# Patient Record
Sex: Female | Born: 1960
Health system: Southern US, Community
[De-identification: ages and names within clinical notes are randomized; demographics above are authoritative.]

## PROBLEM LIST (undated history)

## (undated) DIAGNOSIS — J40 Bronchitis, not specified as acute or chronic: Secondary | ICD-10-CM

## (undated) DIAGNOSIS — T8859XA Other complications of anesthesia, initial encounter: Secondary | ICD-10-CM

## (undated) DIAGNOSIS — N920 Excessive and frequent menstruation with regular cycle: Secondary | ICD-10-CM

## (undated) DIAGNOSIS — J45909 Unspecified asthma, uncomplicated: Secondary | ICD-10-CM

## (undated) DIAGNOSIS — I341 Nonrheumatic mitral (valve) prolapse: Secondary | ICD-10-CM

## (undated) DIAGNOSIS — R011 Cardiac murmur, unspecified: Secondary | ICD-10-CM

## (undated) DIAGNOSIS — H269 Unspecified cataract: Secondary | ICD-10-CM

## (undated) DIAGNOSIS — R519 Headache, unspecified: Secondary | ICD-10-CM

## (undated) DIAGNOSIS — R7303 Prediabetes: Secondary | ICD-10-CM

## (undated) DIAGNOSIS — I10 Essential (primary) hypertension: Secondary | ICD-10-CM

## (undated) DIAGNOSIS — T4145XA Adverse effect of unspecified anesthetic, initial encounter: Secondary | ICD-10-CM

## (undated) DIAGNOSIS — M1990 Unspecified osteoarthritis, unspecified site: Secondary | ICD-10-CM

## (undated) DIAGNOSIS — E7800 Pure hypercholesterolemia, unspecified: Secondary | ICD-10-CM

## (undated) HISTORY — PX: CARPAL TUNNEL RELEASE: SHX101

## (undated) HISTORY — DX: Excessive and frequent menstruation with regular cycle: N92.0

## (undated) HISTORY — DX: Unspecified asthma, uncomplicated: J45.909

## (undated) HISTORY — PX: FOOT SURGERY: SHX648

## (undated) HISTORY — DX: Unspecified cataract: H26.9

## (undated) HISTORY — PX: TUBAL LIGATION: SHX77

---
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
## 2000-06-17 HISTORY — PX: KNEE ARTHROSCOPY: SHX127

## 2003-05-03 ENCOUNTER — Ambulatory Visit (HOSPITAL_BASED_OUTPATIENT_CLINIC_OR_DEPARTMENT_OTHER): Admission: RE | Admit: 2003-05-03 | Discharge: 2003-05-03 | Payer: Self-pay | Admitting: Orthopaedic Surgery

## 2003-05-03 ENCOUNTER — Encounter (INDEPENDENT_AMBULATORY_CARE_PROVIDER_SITE_OTHER): Payer: Self-pay | Admitting: *Deleted

## 2003-05-03 ENCOUNTER — Ambulatory Visit (HOSPITAL_COMMUNITY): Admission: RE | Admit: 2003-05-03 | Discharge: 2003-05-03 | Payer: Self-pay | Admitting: Orthopaedic Surgery

## 2007-09-27 ENCOUNTER — Emergency Department (HOSPITAL_COMMUNITY): Admission: EM | Admit: 2007-09-27 | Discharge: 2007-09-27 | Payer: Self-pay | Admitting: Emergency Medicine

## 2009-02-08 ENCOUNTER — Ambulatory Visit: Payer: Self-pay | Admitting: Women's Health

## 2009-02-08 ENCOUNTER — Other Ambulatory Visit: Admission: RE | Admit: 2009-02-08 | Discharge: 2009-02-08 | Payer: Self-pay | Admitting: Obstetrics and Gynecology

## 2009-02-08 ENCOUNTER — Encounter: Payer: Self-pay | Admitting: Women's Health

## 2009-08-24 ENCOUNTER — Emergency Department (HOSPITAL_COMMUNITY): Admission: EM | Admit: 2009-08-24 | Discharge: 2009-08-25 | Payer: Self-pay | Admitting: Emergency Medicine

## 2010-01-01 ENCOUNTER — Ambulatory Visit: Payer: Self-pay | Admitting: Women's Health

## 2010-01-23 ENCOUNTER — Ambulatory Visit: Payer: Self-pay | Admitting: Obstetrics and Gynecology

## 2010-02-12 ENCOUNTER — Ambulatory Visit: Payer: Self-pay | Admitting: Women's Health

## 2010-02-12 ENCOUNTER — Other Ambulatory Visit: Admission: RE | Admit: 2010-02-12 | Discharge: 2010-02-12 | Payer: Self-pay | Admitting: Obstetrics and Gynecology

## 2010-03-13 ENCOUNTER — Ambulatory Visit: Payer: Self-pay | Admitting: Obstetrics and Gynecology

## 2010-03-23 ENCOUNTER — Ambulatory Visit: Payer: Self-pay | Admitting: Obstetrics and Gynecology

## 2010-03-23 HISTORY — PX: ENDOMETRIAL ABLATION: SHX621

## 2010-04-10 ENCOUNTER — Ambulatory Visit: Payer: Self-pay | Admitting: Obstetrics and Gynecology

## 2010-11-02 NOTE — Op Note (Signed)
[name], Kristina Washington                        ACCOUNT NO.:  [id number]   MEDICAL RECORD NO.:  [id number]                   PATIENT TYPE:  AMB   LOCATION:  DSC                                  FACILITY:  [facility]   PHYSICIAN:  Lubertha Basque. Jerl Santos, M.D.             DATE OF BIRTH:  May 15, 1961   DATE OF PROCEDURE:  05/03/2003  DATE OF DISCHARGE:                                 OPERATIVE REPORT   PREOPERATIVE DIAGNOSES:  Right foot mass.   POSTOPERATIVE DIAGNOSES:  Right foot mass.   OPERATION PERFORMED:  Excision of right foot mass.   SURGEON:  Lubertha Basque. Jerl Santos, M.D.   ASSISTANT:  Prince Rome, P.A.   ANESTHESIA:  Ankle block followed by general.   INDICATIONS FOR PROCEDURE:  The patient is a 50 year old woman with a long  history of a painful mass on the plantar aspect of the right foot.  This was  consistent with a plantar fibroma or Dupuytren's disease.  We had fabricated  an orthotic which did not seem to help significantly and with continued  difficulty on ambulation, she wished to have this mass excised.  She  underwent a similar procedure on the opposite foot many years ago.  Informed  operative consent was obtained after discussion of possible complications of  reaction to anesthesia, infection, neurovascular injury, flattening of arch,  and the probability of recurrence.   DESCRIPTION OF PROCEDURE:  The patient was taken to the operating suite  where ankle block was attempted.  This did not seem to work and was  converted to a general anesthetic by LMA.  The patient was positioned supine  and prepped and draped in the normal sterile fashion.  After administration  of preoperative antibiotics, the right foot was elevated, exsanguinated, and  a tourniquet inflated about the calf.  A lazy-S incision was made with  dissection down to the plantar fascia.  She had a very large nodule which  measured probably 4 cm by 2 cm and was about 2 cm thick.  This was fully  exposed  with care taken to retract the medial plantar nerve out of harm's  way.  The flexor hallucis longus tendon was also protected.  This large  fibrotic mass was removed along with contiguous portions of the plantar  fascia.  A wide excision was performed in hopes of minimizing chance of  recurrence.  The wound was irrigated followed by release of the tourniquet.  A small amount of bleeding was easily controlled with Bovie cautery.  The  skin edges all bled well and showed good refill distally.  A Penrose drain  was placed in the void followed by closure of subcutaneous tissues with 2-0  undyed Vicryl and skin with nylon.  Adaptic was applied to the wound  followed by a compressive dressing with an Ace wrap.  Estimated blood loss  and intraoperative fluids as well as accurate tourniquet time can be  obtained from anesthesia records.  The specimen was sent to pathology.   DISPOSITION:  The patient was extubated in the operating room and taken to  the recovery room in stable condition.  Plans were for the patient to follow  up in a couple of days for dressing change and drain removal.  She will be  discharged home from here today.  I will contact her by phone tonight.                                              Lubertha Basque Jerl Santos, M.D.   PGD/MEDQ  D:  05/03/2003  T:  05/03/2003  Job:  [id number]

## 2011-02-12 DIAGNOSIS — N920 Excessive and frequent menstruation with regular cycle: Secondary | ICD-10-CM | POA: Insufficient documentation

## 2011-02-20 ENCOUNTER — Other Ambulatory Visit (HOSPITAL_COMMUNITY)
Admission: RE | Admit: 2011-02-20 | Discharge: 2011-02-20 | Disposition: A | Discharge status: Home / Self Care | Payer: BC Managed Care – PPO | Source: Ambulatory Visit | Attending: Women's Health | Admitting: Women's Health

## 2011-02-20 ENCOUNTER — Encounter: Payer: Self-pay | Admitting: Women's Health

## 2011-02-20 ENCOUNTER — Ambulatory Visit (INDEPENDENT_AMBULATORY_CARE_PROVIDER_SITE_OTHER): Payer: BC Managed Care – PPO | Admitting: Women's Health

## 2011-02-20 VITALS — BP 130/70 | Ht 70.5 in | Wt 265.0 lb

## 2011-02-20 DIAGNOSIS — Z113 Encounter for screening for infections with a predominantly sexual mode of transmission: Secondary | ICD-10-CM

## 2011-02-20 DIAGNOSIS — Z01419 Encounter for gynecological examination (general) (routine) without abnormal findings: Secondary | ICD-10-CM

## 2011-02-20 DIAGNOSIS — J45909 Unspecified asthma, uncomplicated: Secondary | ICD-10-CM | POA: Insufficient documentation

## 2011-02-20 NOTE — Progress Notes (Signed)
Kristina Washington [birth year] [id number]    History:    The patient presents for annual exam.    Past medical history, past surgical history, family history and social history were all reviewed and documented in the EPIC chart.   ROS:  A  ROS was performed and pertinent positives and negatives are included in the history.  Exam:  Filed Vitals:   02/20/11 0821  BP: 130/70    General appearance:  Normal Head/Neck:  Normal, without cervical or supraclavicular adenopathy. Thyroid:  Symmetrical, normal in size, without palpable masses or nodularity. Respiratory  Effort:  Normal  Auscultation:  Clear without wheezing or rhonchi Cardiovascular  Auscultation:  Regular rate, without rubs, murmurs or gallops  Edema/varicosities:  Not grossly evident Abdominal  Soft,nontender, without masses, guarding or rebound.  Liver/spleen:  No organomegaly noted  Hernia:  None appreciated  Skin  Inspection:  Grossly normal  Palpation:  Grossly normal Neurologic/psychiatric  Orientation:  Normal with appropriate conversation.  Mood/affect:  Normal  Genitourinary    Breasts: Examined lying and sitting.     Right: Without masses, retractions, discharge or axillary adenopathy.     Left: Without masses, retractions, discharge or axillary adenopathy.   Inguinal/mons:  Normal without inguinal adenopathy  External genitalia:  Normal  BUS/Urethra/Skene's glands:  Normal  Bladder:  Normal  Vagina:  Normal  Cervix:  Normal  Uterus:  retroverted, fibroid, 8 wk size, shape and contour.  Midline and mobile  Adnexa/parametria:     Rt: Without masses or tenderness.   Lt: Without masses or tenderness.  Anus and perineum: Normal  Digital rectal exam: Normal sphincter tone without palpated masses or tenderness  Assessment/Plan:  50 y.o. DBF G2P2  for annual exam. Monthly 2-3 d cycle,BTL, ablation 03-23-10 for menorrhagia with good relief.  No complaints.Labs at [facility], states cholesterol slightly elevated, but  not requiring medication.  Encouraged to increase exercise daily, increase fiber rich foods, decrease saturated food, and add a fish oil supplement.  Normal Gyn Exam     Plan:  Pap, GC/Chl only, declines HIV,Hep,RPR, states donates blood and uses condoms. SBE's, yearly mammogram, and again reviewed importance of exercise for health. Calcium rich diet encouraged.      Harrington Challenger [facility], 8:56 AM 02/20/2011

## 2011-02-20 NOTE — Progress Notes (Signed)
Addended byCammie Mcgee T on: 02/20/2011 10:35 AM   Modules accepted: Orders

## 2011-03-12 LAB — URINE MICROSCOPIC-ADD ON

## 2011-03-12 LAB — URINALYSIS, ROUTINE W REFLEX MICROSCOPIC
Bilirubin Urine: NEGATIVE
Nitrite: NEGATIVE
Protein, ur: 100 — AB
Specific Gravity, Urine: 1.002 — ABNORMAL LOW

## 2011-08-14 ENCOUNTER — Ambulatory Visit: Payer: BC Managed Care – PPO | Admitting: Women's Health

## 2012-02-28 ENCOUNTER — Ambulatory Visit (INDEPENDENT_AMBULATORY_CARE_PROVIDER_SITE_OTHER): Payer: BC Managed Care – PPO | Admitting: Women's Health

## 2012-02-28 ENCOUNTER — Encounter: Payer: Self-pay | Admitting: Obstetrics and Gynecology

## 2012-02-28 ENCOUNTER — Encounter: Payer: Self-pay | Admitting: Women's Health

## 2012-02-28 VITALS — BP 138/90 | Ht 70.5 in | Wt 272.0 lb

## 2012-02-28 DIAGNOSIS — Z01419 Encounter for gynecological examination (general) (routine) without abnormal findings: Secondary | ICD-10-CM

## 2012-02-28 DIAGNOSIS — N920 Excessive and frequent menstruation with regular cycle: Secondary | ICD-10-CM

## 2012-02-28 DIAGNOSIS — Z113 Encounter for screening for infections with a predominantly sexual mode of transmission: Secondary | ICD-10-CM

## 2012-02-28 NOTE — Progress Notes (Signed)
Kristina Washington 1960-08-29 [id number]    History:    The patient presents for annual exam.  Monthly 5-6 day cycle/BTL/history of her option  ablation 2011. States cycle flow half of what it had been which is tolerable. History of fibroids. Had labs at primary care-normal per patient. History of normal Paps and mammograms. New partner.   Past medical history, past surgical history, family history and social history were all reviewed and documented in the EPIC chart. Father history of colon cancer age 63. Both parents and sister hypertension, mother and sister with diabetes. Daughter Kristina Washington 23 with lupus, son Kristina Washington 69 high school doing well.   ROS:  A  ROS was performed and pertinent positives and negatives are included in the history.  Exam:  Filed Vitals:   02/28/12 0907  BP: 138/90    General appearance:  Normal Head/Neck:  Normal, without cervical or supraclavicular adenopathy. Thyroid:  Symmetrical, normal in size, without palpable masses or nodularity. Respiratory  Effort:  Normal  Auscultation:  Clear without wheezing or rhonchi Cardiovascular  Auscultation:  Regular rate, without rubs, murmurs or gallops  Edema/varicosities:  Not grossly evident Abdominal  Soft,nontender, without masses, guarding or rebound.  Liver/spleen:  No organomegaly noted  Hernia:  None appreciated  Skin  Inspection:  Grossly normal  Palpation:  Grossly normal Neurologic/psychiatric  Orientation:  Normal with appropriate conversation.  Mood/affect:  Normal  Genitourinary    Breasts: Examined lying and sitting.     Right: Without masses, retractions, discharge or axillary adenopathy.     Left: Without masses, retractions, discharge or axillary adenopathy.   Inguinal/mons:  Normal without inguinal adenopathy  External genitalia:  Normal  BUS/Urethra/Skene's glands:  Normal  Bladder:  Normal  Vagina:  Normal  Cervix:  Normal  Uterus:   Bulky/fibroids 8 weeks' size.  Midline and  mobile  Adnexa/parametria:     Rt: Without masses or tenderness.   Lt: Without masses or tenderness.  Anus and perineum: Normal  Digital rectal exam: Normal sphincter tone without palpated masses or tenderness  Assessment/Plan:  51 y.o. SBF G2 P2 for annual exam with complaint of occasional lower abdominal pain.  Normal GYN exam with relief of menorrhagia with her option ablation 2011/BTL STD screen  Plan: GC/Chlamydia, declines need for HIV hepatitis or RPR, no Pap, history of normal Paps, new screening guidelines reviewed. SBE's, continue annual mammogram, calcium rich diet, home Hemoccult card given with instructions, reviewed importance of screening colonoscopy next year/ family history. Blood pressure slightly elevated, check away from office followup with primary care if needed.    Harrington Challenger WHNP, 1:22 PM 02/28/2012

## 2012-02-28 NOTE — Patient Instructions (Addendum)

## 2012-02-29 LAB — GC/CHLAMYDIA PROBE AMP, GENITAL: Chlamydia, DNA Probe: NEGATIVE

## 2012-02-29 LAB — URINALYSIS W MICROSCOPIC + REFLEX CULTURE
Glucose, UA: NEGATIVE mg/dL
Ketones, ur: NEGATIVE mg/dL
Leukocytes, UA: NEGATIVE
Nitrite: NEGATIVE
Protein, ur: NEGATIVE mg/dL
Specific Gravity, Urine: >1.03 — ABNORMAL HIGH (ref 1.005–1.030)

## 2012-03-23 ENCOUNTER — Other Ambulatory Visit: Payer: Self-pay | Admitting: Anesthesiology

## 2012-03-23 DIAGNOSIS — Z1211 Encounter for screening for malignant neoplasm of colon: Secondary | ICD-10-CM

## 2012-03-24 ENCOUNTER — Other Ambulatory Visit: Payer: Self-pay | Admitting: Women's Health

## 2012-03-24 DIAGNOSIS — Z1211 Encounter for screening for malignant neoplasm of colon: Secondary | ICD-10-CM

## 2012-08-21 ENCOUNTER — Ambulatory Visit (INDEPENDENT_AMBULATORY_CARE_PROVIDER_SITE_OTHER): Payer: BC Managed Care – PPO | Admitting: Women's Health

## 2012-08-21 ENCOUNTER — Encounter: Payer: Self-pay | Admitting: Women's Health

## 2012-08-21 DIAGNOSIS — G8929 Other chronic pain: Secondary | ICD-10-CM

## 2012-08-21 DIAGNOSIS — R1031 Right lower quadrant pain: Secondary | ICD-10-CM

## 2012-08-21 NOTE — Progress Notes (Signed)
Patient ID: Kristina Washington, female   DOB: [birth year], 52 y.o.   MRN: [id number] Presents with complaint of low abdominal/pelvic discomfort on both right and left side, greater on the right side. Discomfort has been intermittent for several months, occurs at various times during her cycle. Same partner. Denies vaginal discharge, urinary symptoms, fever or changes in bowel elimination. Endometrial ablation in 2011, light cycle most months/BTL. Rates pain as intermittently sharp.  Exam: Appears well, abdomen soft, no rebound or radiation of pain. External genitalia within normal limits. Speculum exam no vaginal discharge, erythema noted. Bimanual no CMT or adnexal tenderness with exam.  Right/left pelvic discomfort  Plan: Pelvic ultrasound. If normal will schedule screening colonoscopy, will be 52 in May. Father with history of colon cancer.

## 2012-08-21 NOTE — Patient Instructions (Signed)
Pelvic Pain Pelvic pain is pain below the belly button and located between your hips. Acute pain may last a few hours or days. Chronic pelvic pain may last weeks and months. The cause may be different for different types of pain. The pain may be dull or sharp, mild or severe and can interfere with your daily activities. Write down and tell your caregiver:   Exactly where the pain is located.  If it comes and goes or is there all the time.  When it happens (with sex, urination, bowel movement, etc.)  If the pain is related to your menstrual period or stress. Your caregiver will take a full history and do a complete physical exam and Pap test. CAUSES   Painful menstrual periods (dysmenorrhea).  Normal ovulation (Mittelschmertz) that occurs in the middle of the menstrual cycle every month.  The pelvic organs get engorged with blood just before the menstrual period (pelvic congestive syndrome).  Scar tissue from an infection or past surgery (pelvic adhesions).  Cancer of the female pelvic organs. When there is pain with cancer, it has been there for a long time.  The lining of the uterus (endometrium) abnormally grows in places like the pelvis and on the pelvic organs (endometriosis).  A form of endometriosis with the lining of the uterus present inside of the muscle tissue of the uterus (adenomyosis).  Fibroid tumor (noncancerous) in the uterus.  Bladder problems such as infection, bladder spasms of the muscle tissue of the bladder.  Intestinal problems (irritable bowel syndrome, colitis, an ulcer or gastrointestinal infection).  Polyps of the cervix or uterus.  Pregnancy in the tube (ectopic pregnancy).  The opening of the cervix is too small for the menstrual blood to flow through it (cervical stenosis).  Physical or sexual abuse (past or present).  Musculo-skeletal problems from poor posture, problems with the vertebrae of the lower back or the uterine pelvic muscles falling  (prolapse).  Psychological problems such as depression or stress.  IUD (intrauterine device) in the uterus. DIAGNOSIS  Tests to make a diagnosis depends on the type, location, severity and what causes the pain to occur. Tests that may be needed include:  Blood tests.  Urine tests  Ultrasound.  X-rays.  CT Scan.  MRI.  Laparoscopy.  Major surgery. TREATMENT  Treatment will depend on the cause of the pain, which includes:  Prescription or over-the-counter pain medication.  Antibiotics.  Birth control pills.  Hormone treatment.  Nerve blocking injections.  Physical therapy.  Antidepressants.  Counseling with a psychiatrist or psychologist.  Minor or major surgery. HOME CARE INSTRUCTIONS   Only take over-the-counter or prescription medicines for pain, discomfort or fever as directed by your caregiver.  Follow your caregiver's advice to treat your pain.  Rest.  Avoid sexual intercourse if it causes the pain.  Apply warm or cold compresses (which ever works best) to the pain area.  Do relaxation exercises such as yoga or meditation.  Try acupuncture.  Avoid stressful situations.  Try group therapy.  If the pain is because of a stomach/intestinal upset, drink clear liquids, eat a bland light food diet until the symptoms go away. SEEK MEDICAL CARE IF:   You need stronger prescription pain medication.  You develop pain with sexual intercourse.  You have pain with urination.  You develop a temperature of 102 F (38.9 C) with the pain.  You are still in pain after 4 hours of taking prescription medication for the pain.  You need depression medication.    Your IUD is causing pain and you want it removed. SEEK IMMEDIATE MEDICAL CARE IF:  You develop very severe pain or tenderness.  You faint, have chills, severe weakness or dehydration.  You develop heavy vaginal bleeding or passing solid tissue.  You develop a temperature of 102 F (38.9 C)  with the pain.  You have blood in the urine.  You are being physically or sexually abused.  You have uncontrolled vomiting and diarrhea.  You are depressed and afraid of harming yourself or someone else. Document Released: 07/11/2004 Document Revised: 08/26/2011 Document Reviewed: 04/07/2008 [employer] Patient Information 2013 [employer].  

## 2012-08-31 ENCOUNTER — Encounter: Payer: Self-pay | Admitting: Women's Health

## 2012-08-31 ENCOUNTER — Ambulatory Visit: Payer: BC Managed Care – PPO

## 2012-08-31 ENCOUNTER — Ambulatory Visit: Payer: BC Managed Care – PPO | Admitting: Women's Health

## 2012-08-31 DIAGNOSIS — D252 Subserosal leiomyoma of uterus: Secondary | ICD-10-CM

## 2012-08-31 DIAGNOSIS — D259 Leiomyoma of uterus, unspecified: Secondary | ICD-10-CM

## 2012-08-31 DIAGNOSIS — N8300 Follicular cyst of ovary, unspecified side: Secondary | ICD-10-CM

## 2012-08-31 DIAGNOSIS — R9389 Abnormal findings on diagnostic imaging of other specified body structures: Secondary | ICD-10-CM

## 2012-08-31 DIAGNOSIS — N852 Hypertrophy of uterus: Secondary | ICD-10-CM

## 2012-08-31 DIAGNOSIS — D251 Intramural leiomyoma of uterus: Secondary | ICD-10-CM

## 2012-08-31 DIAGNOSIS — D219 Benign neoplasm of connective and other soft tissue, unspecified: Secondary | ICD-10-CM

## 2012-08-31 DIAGNOSIS — R1031 Right lower quadrant pain: Secondary | ICD-10-CM

## 2012-08-31 NOTE — Patient Instructions (Addendum)
Uterine Fibroid A uterine fibroid is a growth (tumor) that occurs in a woman's uterus. This type of tumor is not cancerous and does not spread out of the uterus. A woman can have one or many fibroids, and the fiboid(s) can become quite large. A fibroid can vary in size, weight, and where it grows in the uterus. Most fibroids do not require medical treatment, but some can cause pain or heavy bleeding during and between periods. CAUSES  A fibroid is the result of a single uterine cell that keeps growing (unregulated), which is different than most cells in the human body. Most cells have a control mechanism that keeps them from reproducing without control.  SYMPTOMS   Bleeding.  Pelvic pain and pressure.  Bladder problems due to the size of the fibroid.  Infertility and miscarriages depending on the size and location of the fibroid. DIAGNOSIS  A diagnosis is made by physical exam. Your caregiver may feel the lumpy tumors during a pelvic exam. Important information regarding size, location, and number of tumors can be gained by having an ultrasound. It is rare that other tests, such as a CT scan or MRI, are needed. TREATMENT   Your caregiver may recommend watchful waiting. This involves getting the fibroid checked by your caregiver to see if the fibroids grow or shrink.   Hormonal treatment or an intrauterine device (IUD) may be prescribed.   Surgery may be needed to remove the fibroids (myomectomy) or the uterus (hysterectomy). This depends on your situation. When fibroids interfere with fertility and a woman wants to become pregnant, a caregiver may recommend having the fibroids removed.  HOME CARE INSTRUCTIONS  Home care depends on how you were treated. In general:   Keep all follow-up appointments with your caregiver.   Only take medicine as told by your caregiver. Do not take aspirin. It can cause bleeding.   If you have excessive periods and soak tampons or pads in a half hour or  less, contact your caregiver immediately. If your periods are troublesome but not so heavy, lie down with your feet raised slightly above your heart. Place cold packs on your lower abdomen.   If your periods are heavy, write down the number of pads or tampons you use per month. Bring this information to your caregiver.   Talk to your caregiver about taking iron pills.   Include green vegetables in your diet.   If you were prescribed a hormonal treatment, take the hormonal medicines as directed.   If you need surgery, ask your caregiver for information on your specific surgery.  SEEK IMMEDIATE MEDICAL CARE IF:  You have pelvic pain or cramps not controlled with medicines.   You have a sudden increase in pelvic pain.   You have an increase of bleeding between and during periods.   You feel lightheaded or have fainting episodes.  MAKE SURE YOU:  Understand these instructions.  Will watch your condition.  Will get help right away if you are not doing well or get worse. Document Released: 05/31/2000 Document Revised: 08/26/2011 Document Reviewed: 06/24/2011 [facility] Patient Information 2013 [place], [place].

## 2012-08-31 NOTE — Progress Notes (Signed)
Patient ID: Kristina Washington, female   DOB: 04-15-1961, 52 y.o.   MRN: [id number] Presents for ultrasound due to intermittent right lower quadrant pain.denies vaginal, urinary symptoms,I  GI changes or fever. History of  HER option ablation 2011/ BTL. Pain today 0, occasionally 8.   Ultrasound: Enlarged heterogeneous uterus with intramural fibroids 20 one by 14 mm, 12 mm, right subserous 44 x 39 mm. Prominent endometrial with positive CFD with echo probable defect seen. Right ovary normal. Left ovary thickwalled follicle 14 x 13 mm. Negative cul-de-sac. Endometrium 10.9 mm  Fibroids Possible endometrial polyp  Plan: Motrin 600 every 8 hours as needed for discomfort.  Schedule sonohysterogram with Dr. Lily Peer.

## 2012-09-10 ENCOUNTER — Encounter: Payer: Self-pay | Admitting: Women's Health

## 2012-09-16 ENCOUNTER — Ambulatory Visit (INDEPENDENT_AMBULATORY_CARE_PROVIDER_SITE_OTHER): Payer: BC Managed Care – PPO | Admitting: Gynecology

## 2012-09-16 ENCOUNTER — Other Ambulatory Visit: Payer: Self-pay | Admitting: Gynecology

## 2012-09-16 ENCOUNTER — Ambulatory Visit (INDEPENDENT_AMBULATORY_CARE_PROVIDER_SITE_OTHER): Payer: BC Managed Care – PPO

## 2012-09-16 DIAGNOSIS — D259 Leiomyoma of uterus, unspecified: Secondary | ICD-10-CM | POA: Insufficient documentation

## 2012-09-16 DIAGNOSIS — N951 Menopausal and female climacteric states: Secondary | ICD-10-CM

## 2012-09-16 DIAGNOSIS — R9389 Abnormal findings on diagnostic imaging of other specified body structures: Secondary | ICD-10-CM

## 2012-09-16 DIAGNOSIS — D219 Benign neoplasm of connective and other soft tissue, unspecified: Secondary | ICD-10-CM

## 2012-09-16 NOTE — Progress Notes (Signed)
Patient presented to the office today for sonohysterogram. She had been seen the office on March 17 by Maryelizabeth Rowan nurse practitioner to discuss an ultrasound since the patient had been complaining of right lower quadrant discomfort. Review of her record indicated that in 2011 she had the her option endometrial ablation and prior to that she had a tubal sterilization procedure. The ultrasound on that date demonstrated the following:  Ultrasound: Enlarged heterogeneous uterus with intramural fibroids 20 one by 14 mm, 12 mm, right subserous 44 x 39 mm. Prominent endometrial with positive CFD with echo probable defect seen. Right ovary normal. Left ovary thickwalled follicle 14 x 13 mm. Negative cul-de-sac. Endometrium 10.9 mm   Patient is currently perimenopausal at 52 years of age. She had a recent menstrual cycle but prior to that it had been 2 months. She denies any vasomotor symptoms.  Sonohysterogram today: Cortical cystic areas noted in the uterus and no change on fibroid size from previous ultrasound scan. Tried layered endometrial cavity with previous focal area not seen from prior ultrasound exam. Sonohysterogram had a saline infusion did not demonstrate any intracavitary defect.  Patient was reassured with the findings above. We'll continue to monitor her cycles and if she has any symptoms associated with the menopause such as hot flashes, irritability, or mood swing we'll address at that point the possibility of treating her with hormone replacement therapy after confirming an [facility].

## 2012-10-19 ENCOUNTER — Emergency Department (HOSPITAL_BASED_OUTPATIENT_CLINIC_OR_DEPARTMENT_OTHER)
Admission: EM | Admit: 2012-10-19 | Discharge: 2012-10-19 | Disposition: A | Discharge status: Home / Self Care | Payer: BC Managed Care – PPO | Attending: Emergency Medicine | Admitting: Emergency Medicine

## 2012-10-19 ENCOUNTER — Encounter (HOSPITAL_BASED_OUTPATIENT_CLINIC_OR_DEPARTMENT_OTHER): Payer: Self-pay | Admitting: *Deleted

## 2012-10-19 DIAGNOSIS — J40 Bronchitis, not specified as acute or chronic: Secondary | ICD-10-CM

## 2012-10-19 DIAGNOSIS — Z87798 Personal history of other (corrected) congenital malformations: Secondary | ICD-10-CM | POA: Insufficient documentation

## 2012-10-19 DIAGNOSIS — J209 Acute bronchitis, unspecified: Secondary | ICD-10-CM | POA: Insufficient documentation

## 2012-10-19 DIAGNOSIS — Z79899 Other long term (current) drug therapy: Secondary | ICD-10-CM | POA: Insufficient documentation

## 2012-10-19 DIAGNOSIS — J3489 Other specified disorders of nose and nasal sinuses: Secondary | ICD-10-CM | POA: Insufficient documentation

## 2012-10-19 DIAGNOSIS — Z8669 Personal history of other diseases of the nervous system and sense organs: Secondary | ICD-10-CM | POA: Insufficient documentation

## 2012-10-19 MED ORDER — AZITHROMYCIN 250 MG PO TABS: ORAL_TABLET | ORAL | Status: DC

## 2012-10-19 NOTE — ED Provider Notes (Signed)
History  This chart was scribed for non-physician practitioner, Elson Areas, PA-C working with Shelda Jakes, MD by Shari Heritage, ED Scribe. This patient was seen in room MH01/MH01 and the patient's care was started at 1921.   CSN: [id number]  Arrival date & time 10/19/12  1801   First MD Initiated Contact with Patient 10/19/12 1921      Chief Complaint  Patient presents with  . Cough     Patient is a 52 y.o. female presenting with cough. The history is provided by the patient. No language interpreter was used.  Cough Cough characteristics:  Dry and hacking Severity:  Moderate Onset quality:  Gradual Duration: 1.5 weeks. Timing:  Constant Progression:  Unchanged Chronicity:  New Smoker: no   Associated symptoms: sinus congestion   Associated symptoms: no fever, no shortness of breath and no sore throat     HPI Comments: Kristina Washington is a 52 y.o. female who presents to the Emergency Department complaining of constant moderate dry hacking cough and nasal congestion onset 1.5 weeks ago. Patient denies sore throat, shortness of breath or fever. Her son is here with similar symptoms, but she states that she developed symptoms a few days after he did. Patient has a medical history of asthma. She does not smoke.   PCP- Doreen Salvage  Past Medical History  Diagnosis Date  . Menorrhagia   . Bronchial asthma   . Cataracts, bilateral     Past Surgical History  Procedure Laterality Date  . Tubal ligation    . Foot surgery      BOTH RIGHT AND LEFT  . Endometrial ablation  03/23/10  . Knee arthroscopy  2002    right knee    Family History  Problem Relation Age of Onset  . Diabetes Mother   . Hypertension Mother   . Cancer Mother   . Hypertension Father   . Cancer Father     COLON  . Diabetes Sister   . Hypertension Sister   . Diabetes Paternal Aunt   . Diabetes Paternal Uncle     History  Substance Use Topics  . Smoking status: Never Smoker   . Smokeless  tobacco: Never Used  . Alcohol Use: No    OB History   Grav Para Term Preterm Abortions TAB SAB Ect Mult Living   2 2        2       Review of Systems  Constitutional: Negative for fever.  HENT: Positive for congestion. Negative for sore throat.   Respiratory: Positive for cough. Negative for shortness of breath.   All other systems reviewed and are negative.    Allergies  Review of patient's allergies indicates no known allergies.  Home Medications   Current Outpatient Rx  Name  Route  Sig  Dispense  Refill  . albuterol (PROVENTIL HFA;VENTOLIN HFA) 108 (90 BASE) MCG/ACT inhaler   Inhalation   Inhale 2 puffs into the lungs every 6 (six) hours as needed.           . montelukast (SINGULAIR) 10 MG tablet   Oral   Take 10 mg by mouth at bedtime.         . triamterene-hydrochlorothiazide (DYAZIDE) 37.5-25 MG per capsule   Oral   Take 1 capsule by mouth every morning.           Triage vitals: BP 145/95  Pulse 98  Temp(Src) 99.6 F (37.6 C) (Oral)  Resp 18  SpO2 97%  Physical  Exam  Constitutional: She is oriented to person, place, and time. She appears well-developed and well-nourished.  HENT:  Head: Normocephalic and atraumatic.  Mouth/Throat: Oropharynx is clear and moist.  Eyes: Conjunctivae and EOM are normal. Pupils are equal, round, and reactive to light.  Neck: Normal range of motion. Neck supple.  Cardiovascular: Normal rate, regular rhythm and normal heart sounds.   Pulmonary/Chest: Effort normal and breath sounds normal.  Musculoskeletal: Normal range of motion.  Neurological: She is alert and oriented to person, place, and time.  Skin: Skin is warm and dry.  Psychiatric: She has a normal mood and affect. Her behavior is normal.    ED Course  Procedures (including critical care time) DIAGNOSTIC STUDIES: Oxygen Saturation is 97% on room air, adequate by my interpretation.    COORDINATION OF CARE: 7:40 PM- Patient informed of current plan for  treatment and evaluation and agrees with plan at this time.     1. Bronchitis       MDM  Pt given rx for zithromax.   I advised try zyrtec D for allergies       I personally performed the services in this documentation, which was scribed in my presence.  The recorded information has been reviewed and considered.   Barnet Pall.   Lonia Skinner [place], PA-C 10/19/12 2001

## 2012-10-19 NOTE — ED Notes (Signed)
Cough and stuffy nose x 2 weeks.

## 2012-10-21 NOTE — ED Provider Notes (Signed)
Medical screening examination/treatment/procedure(s) were performed by non-physician practitioner and as supervising physician I was immediately available for consultation/collaboration.   Shelda Jakes, MD 10/21/12 [phone]

## 2013-03-04 ENCOUNTER — Encounter: Payer: BC Managed Care – PPO | Admitting: Women's Health

## 2013-03-09 ENCOUNTER — Encounter: Payer: Self-pay | Admitting: Women's Health

## 2013-03-10 ENCOUNTER — Ambulatory Visit (INDEPENDENT_AMBULATORY_CARE_PROVIDER_SITE_OTHER): Payer: BC Managed Care – PPO | Admitting: Women's Health

## 2013-03-10 ENCOUNTER — Other Ambulatory Visit (HOSPITAL_COMMUNITY)
Admission: RE | Admit: 2013-03-10 | Discharge: 2013-03-10 | Disposition: A | Discharge status: Home / Self Care | Payer: BC Managed Care – PPO | Source: Ambulatory Visit | Attending: Gynecology | Admitting: Gynecology

## 2013-03-10 ENCOUNTER — Encounter: Payer: Self-pay | Admitting: Women's Health

## 2013-03-10 VITALS — BP 132/88 | Ht 70.0 in | Wt 272.8 lb

## 2013-03-10 DIAGNOSIS — Z23 Encounter for immunization: Secondary | ICD-10-CM

## 2013-03-10 DIAGNOSIS — Z01419 Encounter for gynecological examination (general) (routine) without abnormal findings: Secondary | ICD-10-CM

## 2013-03-10 NOTE — Patient Instructions (Addendum)

## 2013-03-10 NOTE — Progress Notes (Signed)
Kristina Washington September 26, 1960 [id number]    History:    The patient presents for annual exam.  BTL/her option 2011 with monthly cycle light flow for 5-7 days. Normal mammogram and Pap history. Fibroids 21x 14, 12 mm, 44 x 39 mm- US-2014 .   Past medical history, past surgical history, family history and social history were all reviewed and documented in the EPIC chart. Works at [employer]. [name] 26 with lupus pregnant. Brandon 20 at [place] doing well. Father colon Cancer age 73. M, S - diabetes,  M, F, S - hypertension   ROS:  A  ROS was performed and pertinent positives and negatives are included in the history.  Exam:  Filed Vitals:   03/10/13 1103  BP: 132/88    General appearance:  Normal Head/Neck:  Normal, without cervical or supraclavicular adenopathy. Thyroid:  Symmetrical, normal in size, without palpable masses or nodularity. Respiratory  Effort:  Normal  Auscultation:  Clear without wheezing or rhonchi Cardiovascular  Auscultation:  Regular rate, without rubs, murmurs or gallops  Edema/varicosities:  Not grossly evident Abdominal  Soft,nontender, without masses, guarding or rebound.  Liver/spleen:  No organomegaly noted  Hernia:  None appreciated  Skin  Inspection:  Grossly normal  Palpation:  Grossly normal Neurologic/psychiatric  Orientation:  Normal with appropriate conversation.  Mood/affect:  Normal  Genitourinary    Breasts: Examined lying and sitting.     Right: Without masses, retractions, discharge or axillary adenopathy.     Left: Without masses, retractions, discharge or axillary adenopathy.   Inguinal/mons:  Normal without inguinal adenopathy  External genitalia:  Normal  BUS/Urethra/Skene's glands:  Normal  Bladder:  Normal  Vagina:  Normal  Cervix:  Normal  Uterus:   Bulky fibroid shape and contour.  Midline and mobile  Adnexa/parametria:     Rt: Without masses or tenderness.   Lt: Without masses or tenderness.  Anus and  perineum: Normal  Digital rectal exam: Normal sphincter tone without palpated masses or tenderness  Assessment/Plan:  52 y.o. MBF G2P2 for annual exam with no complaints.  BTL/her option ablation 2011 light monthly 5-7 day cycle Labs-primary care Asymptomatic fibroids Obesity  And: SBE's, continue annual mammogram, calcium rich diet, vitamin D 1000 daily encouraged. Pap, normal Pap 2012, new screening guidelines reviewed. Reviewed importance of increasing exercise and decreasing calories for weight loss and health.    Harrington Challenger WHNP, 1:12 PM 03/10/2013

## 2013-12-15 ENCOUNTER — Emergency Department (HOSPITAL_BASED_OUTPATIENT_CLINIC_OR_DEPARTMENT_OTHER): Payer: BC Managed Care – PPO

## 2013-12-15 ENCOUNTER — Emergency Department (HOSPITAL_BASED_OUTPATIENT_CLINIC_OR_DEPARTMENT_OTHER)
Admission: EM | Admit: 2013-12-15 | Discharge: 2013-12-15 | Disposition: A | Discharge status: Home / Self Care | Payer: BC Managed Care – PPO | Attending: Emergency Medicine | Admitting: Emergency Medicine

## 2013-12-15 ENCOUNTER — Encounter (HOSPITAL_BASED_OUTPATIENT_CLINIC_OR_DEPARTMENT_OTHER): Payer: Self-pay | Admitting: Emergency Medicine

## 2013-12-15 DIAGNOSIS — J45909 Unspecified asthma, uncomplicated: Secondary | ICD-10-CM | POA: Insufficient documentation

## 2013-12-15 DIAGNOSIS — H269 Unspecified cataract: Secondary | ICD-10-CM | POA: Insufficient documentation

## 2013-12-15 DIAGNOSIS — Z8742 Personal history of other diseases of the female genital tract: Secondary | ICD-10-CM | POA: Insufficient documentation

## 2013-12-15 DIAGNOSIS — J40 Bronchitis, not specified as acute or chronic: Secondary | ICD-10-CM

## 2013-12-15 MED ORDER — AZITHROMYCIN 250 MG PO TABS: 250.0000 mg | ORAL_TABLET | Freq: Every day | ORAL | Status: DC

## 2013-12-15 MED ORDER — ALBUTEROL SULFATE HFA 108 (90 BASE) MCG/ACT IN AERS: 2.0000 | INHALATION_SPRAY | RESPIRATORY_TRACT | Status: DC | PRN

## 2013-12-15 NOTE — ED Notes (Signed)
C/o cough x 1 month. Unable to get any sputum up with cough. No fever. States she feels like she has post nasal drip. No other sx.

## 2013-12-15 NOTE — Discharge Instructions (Signed)
Bronchitis  Bronchitis is inflammation of the airways that extend from the windpipe into the lungs (bronchi). The inflammation often causes mucus to develop, which leads to a cough. If the inflammation becomes severe, it may cause shortness of breath.  CAUSES   Bronchitis may be caused by:    Viral infections.    Bacteria.    Cigarette smoke.    Allergens, pollutants, and other irritants.   SIGNS AND SYMPTOMS   The most common symptom of bronchitis is a frequent cough that produces mucus. Other symptoms include:   Fever.    Body aches.    Chest congestion.    Chills.    Shortness of breath.    Sore throat.   DIAGNOSIS   Bronchitis is usually diagnosed through a medical history and physical exam. Tests, such as chest X-rays, are sometimes done to rule out other conditions.   TREATMENT   You may need to avoid contact with whatever caused the problem (smoking, for example). Medicines are sometimes needed. These may include:   Antibiotics. These may be prescribed if the condition is caused by bacteria.   Cough suppressants. These may be prescribed for relief of cough symptoms.    Inhaled medicines. These may be prescribed to help open your airways and make it easier for you to breathe.    Steroid medicines. These may be prescribed for those with recurrent (chronic) bronchitis.  HOME CARE INSTRUCTIONS   Get plenty of rest.    Drink enough fluids to keep your urine clear or pale yellow (unless you have a medical condition that requires fluid restriction). Increasing fluids may help thin your secretions and will prevent dehydration.    Only take over-the-counter or prescription medicines as directed by your health care provider.   Only take antibiotics as directed. Make sure you finish them even if you start to feel better.   Avoid secondhand smoke, irritating chemicals, and strong fumes. These will make bronchitis worse. If you are a smoker, quit smoking. Consider using nicotine gum or  skin patches to help control withdrawal symptoms. Quitting smoking will help your lungs heal faster.    Put a cool-mist humidifier in your bedroom at night to moisten the air. This may help loosen mucus. Change the water in the humidifier daily. You can also run the hot water in your shower and sit in the bathroom with the door closed for 5-10 minutes.    Follow up with your health care provider as directed.    Wash your hands frequently to avoid catching bronchitis again or spreading an infection to others.   SEEK MEDICAL CARE IF:  Your symptoms do not improve after 1 week of treatment.   SEEK IMMEDIATE MEDICAL CARE IF:   Your fever increases.   You have chills.    You have chest pain.    You have worsening shortness of breath.    You have bloody sputum.   You faint.   You have lightheadedness.   You have a severe headache.    You vomit repeatedly.  MAKE SURE YOU:    Understand these instructions.   Will watch your condition.   Will get help right away if you are not doing well or get worse.  Document Released: 06/03/2005 Document Revised: 03/24/2013 Document Reviewed: 01/26/2013  ExitCare Patient Information 2015 [employer]. This information is not intended to replace advice given to you by your health care provider. Make sure you discuss any questions you have with your health care   provider.

## 2013-12-15 NOTE — ED Provider Notes (Signed)
Medical screening examination/treatment/procedure(s) were performed by non-physician practitioner and as supervising physician I was immediately available for consultation/collaboration.   EKG Interpretation None        Sharyon Cable, MD 12/15/13 1336

## 2013-12-15 NOTE — ED Provider Notes (Signed)
CSN: [id number]     Arrival date & time 12/15/13  1140 History   First MD Initiated Contact with Patient 12/15/13 1211     Chief Complaint  Patient presents with  . Cough     (Consider location/radiation/quality/duration/timing/severity/associated sxs/prior Treatment) Patient is a 53 y.o. female presenting with cough. The history is provided by the patient. No language interpreter was used.  Cough Cough characteristics:  Productive Sputum characteristics:  Nondescript Severity:  Moderate Onset quality:  Gradual Duration:  4 weeks Timing:  Constant Progression:  Worsening Chronicity:  New Relieved by:  Nothing Worsened by:  Nothing tried Ineffective treatments:  None tried   Past Medical History  Diagnosis Date  . Menorrhagia   . Bronchial asthma   . Cataracts, bilateral    Past Surgical History  Procedure Laterality Date  . Tubal ligation    . Foot surgery      BOTH RIGHT AND LEFT  . Endometrial ablation  03/23/10  . Knee arthroscopy  2002    right knee   Family History  Problem Relation Age of Onset  . Diabetes Mother   . Hypertension Mother   . Cancer Mother   . Hypertension Father   . Cancer Father     COLON  . Diabetes Sister   . Hypertension Sister   . Diabetes Paternal Aunt   . Diabetes Paternal Uncle    History  Substance Use Topics  . Smoking status: Never Smoker   . Smokeless tobacco: Never Used  . Alcohol Use: No   OB History   Grav Para Term Preterm Abortions TAB SAB Ect Mult Living   2 2        2      Review of Systems  Respiratory: Positive for cough.   All other systems reviewed and are negative.     Allergies  Review of patient's allergies indicates no known allergies.  Home Medications   Prior to Admission medications   Medication Sig Start Date End Date Taking? Authorizing Provider  albuterol (PROVENTIL HFA;VENTOLIN HFA) 108 (90 BASE) MCG/ACT inhaler Inhale 2 puffs into the lungs every 6 (six) hours as needed.       Historical Provider, MD  montelukast (SINGULAIR) 10 MG tablet Take 10 mg by mouth at bedtime.    Historical Provider, MD  triamterene-hydrochlorothiazide (DYAZIDE) 37.5-25 MG per capsule Take 1 capsule by mouth every morning.    Historical Provider, MD   BP 175/90  Pulse 82  Temp(Src) 98.4 F (36.9 C) (Oral)  Resp 16  SpO2 95%  LMP 12/03/2013 Physical Exam  Nursing note and vitals reviewed. Constitutional: She is oriented to person, place, and time. She appears well-developed and well-nourished.  HENT:  Head: Normocephalic and atraumatic.  Eyes: EOM are normal. Pupils are equal, round, and reactive to light.  Neck: Normal range of motion.  Cardiovascular: Normal rate and normal heart sounds.   Pulmonary/Chest: Effort normal and breath sounds normal.  Abdominal: She exhibits no distension.  Musculoskeletal: Normal range of motion.  Neurological: She is alert and oriented to person, place, and time.  Psychiatric: She has a normal mood and affect.    ED Course  Procedures (including critical care time) Labs Review Labs Reviewed - No data to display  Imaging Review Dg Chest 2 View  12/15/2013   CLINICAL DATA:  Cough, congestion, history bronchial asthma  EXAM: CHEST  2 VIEW  COMPARISON:  08/24/2009  FINDINGS: Normal heart size, mediastinal contours, and pulmonary vascularity.  Lungs clear.  No pleural effusion or pneumothorax.  Clothing artifacts project over the apices.  No acute osseous findings.  IMPRESSION: No acute abnormalities.   Electronically Signed   By: Lavonia Dana M.D.   On: 12/15/2013 12:18     EKG Interpretation None      MDM   Final diagnoses:  Bronchitis    Chest xray no pneumonia zithromax Albuterol inhaler  Fransico Meadow, PA-C 12/15/13 [place], PA-C 12/15/13 1234

## 2014-03-01 ENCOUNTER — Ambulatory Visit (INDEPENDENT_AMBULATORY_CARE_PROVIDER_SITE_OTHER): Payer: No Typology Code available for payment source | Admitting: Women's Health

## 2014-03-01 ENCOUNTER — Encounter: Payer: Self-pay | Admitting: Women's Health

## 2014-03-01 VITALS — BP 110/78

## 2014-03-01 DIAGNOSIS — B3749 Other urogenital candidiasis: Secondary | ICD-10-CM

## 2014-03-01 DIAGNOSIS — N3000 Acute cystitis without hematuria: Secondary | ICD-10-CM

## 2014-03-01 DIAGNOSIS — N3001 Acute cystitis with hematuria: Secondary | ICD-10-CM

## 2014-03-01 LAB — URINALYSIS W MICROSCOPIC + REFLEX CULTURE
BILIRUBIN URINE: NEGATIVE
CASTS: NONE SEEN
CRYSTALS: NONE SEEN
Glucose, UA: 100 mg/dL — AB
KETONES UR: NEGATIVE mg/dL
Nitrite: POSITIVE — AB
Protein, ur: 100 mg/dL — AB
Specific Gravity, Urine: <1.005 — ABNORMAL LOW (ref 1.005–1.030)
UROBILINOGEN UA: 1 mg/dL (ref 0.0–1.0)
pH: 6 (ref 5.0–8.0)

## 2014-03-01 MED ORDER — SULFAMETHOXAZOLE-TRIMETHOPRIM 800-160 MG PO TABS: 1.0000 | ORAL_TABLET | Freq: Two times a day (BID) | ORAL | Status: DC

## 2014-03-01 NOTE — Addendum Note (Signed)
Addended by: Catha Brow on: 03/01/2014 04:36 PM   Modules accepted: Orders

## 2014-03-01 NOTE — Progress Notes (Signed)
Presents with complaints of urgency, frequency, and low abd pressure since this morning.  Pressure is felt in the middle of lower abdomen, not localized to one side.  Dysuria and pain at the end of the stream.  Tried AZO this morning without relief.  Denies hematuria, vaginal d/c, back pain, fever, chills, sweats, nausea, vomiting.  Cycles most months/cycles becoming more irregular/BTL.  Exam: Appears well but uncomfortable.  Abd: obese, soft, non-tender, non-distended.  No CVA tenderness.  UA: 100mg /dL glucose, large blood, 100mg /dL protein, positive nitrites, small leukocytes,  21-50 WBC, RBC TNTC, many bacteria, few squamous epithelials.      Acute cystitis with hematuria   Plan:   Septra DS 1 tab PO BID x 3 days #6, administration and side effects reviewed.   Urine culture pending.  Contact office if not improving in 2-3 days. Follow up for test of cure in resolution of hematuria at annual visit next week.

## 2014-03-01 NOTE — Patient Instructions (Signed)

## 2014-03-04 LAB — URINE CULTURE

## 2014-03-11 ENCOUNTER — Encounter: Payer: BC Managed Care – PPO | Admitting: Women's Health

## 2014-04-18 ENCOUNTER — Encounter: Payer: Self-pay | Admitting: Women's Health

## 2014-06-01 ENCOUNTER — Ambulatory Visit (INDEPENDENT_AMBULATORY_CARE_PROVIDER_SITE_OTHER): Payer: No Typology Code available for payment source | Admitting: Neurology

## 2014-06-01 ENCOUNTER — Ambulatory Visit (INDEPENDENT_AMBULATORY_CARE_PROVIDER_SITE_OTHER): Payer: Self-pay | Admitting: Neurology

## 2014-06-01 DIAGNOSIS — R202 Paresthesia of skin: Secondary | ICD-10-CM

## 2014-06-01 DIAGNOSIS — Z0289 Encounter for other administrative examinations: Secondary | ICD-10-CM

## 2014-06-01 NOTE — Progress Notes (Signed)
  [facility]    Provider:  Dr Jaynee Eagles Referring Provider: Egbert Garibaldi, MD Primary Care Physician:  No primary care provider on file.  HPI:  Kristina Washington is a 53 y.o. female here as a referral from Dr. Dallas Breeding for bilateral hand pain. She has cramping in the fingers and hands, very painful. Been going on for 2 months. Worsening. Has paresthesias and numbness radiating from the left shoulder to the elbow. No neck pain. Having weakness of grip and opening jars R>L.   Summary: Nerve Conduction studies were performed on the bilateral upper extremities.  Ulnar and Median motor conductions were within normal limits with normal F wave latencies.  2nd-digit Median, 5th-digit Ulnar and Radial sensory conductions were within normal limits.  Median/Ulnar (palm) comparison studies showed normal peak latency difference  EMG needle evaluation was performed on selected bilateral upper extremity muscles: The Deltoid, Triceps, Flexor Digitorum Profundus (ulnar), Pronator Teres, First Dorsal Interoseous, Opponens Pollicis and K0/U5/K2 paraspinal muscles were normal.  Conclusion: This is a normal study. No electrophysiologic evidence for median or ulnar neuropathy or cervical radiculopathy.   Sarina Ill, MD  [facility] [address] [place], [place] [address]  Phone [phone] Fax [phone]

## 2014-06-02 NOTE — Progress Notes (Signed)
[facility]    Provider:  Dr Jaynee Eagles Referring Provider: Egbert Garibaldi, MD Primary Care Physician:  No primary care provider on file.  HPI:  Kristina Washington is a 53 y.o. female here as a referral from Dr. Dallas Breeding for bilateral hand pain. She has cramping in the fingers and hands, very painful. Been going on for 2 months. Worsening. Has paresthesias and numbness radiating from the left shoulder to the elbow. No neck pain. Having weakness of grip and opening jars R>L.   Summary: Nerve Conduction studies were performed on the bilateral upper extremities.  Ulnar and Median motor conductions were within normal limits with normal F wave latencies.  2nd-digit Median, 5th-digit Ulnar and Radial sensory conductions were within normal limits.  Median/Ulnar (palm) comparison studies showed normal peak latency difference  EMG needle evaluation was performed on selected bilateral upper extremity muscles: The Deltoid, Triceps, Flexor Digitorum Profundus (ulnar), Pronator Teres, First Dorsal Interoseous, Opponens Pollicis and E7/U0/T2 paraspinal muscles were normal.  Conclusion: This is a normal study. No electrophysiologic evidence for median or ulnar neuropathy or cervical radiculopathy.   Sarina Ill, MD  [facility] [address] [place], [place] [address]  Phone [phone] Fax [phone]

## 2014-06-03 ENCOUNTER — Ambulatory Visit: Payer: No Typology Code available for payment source | Admitting: Neurology

## 2014-06-03 ENCOUNTER — Other Ambulatory Visit: Payer: Self-pay | Admitting: Radiology

## 2014-06-03 DIAGNOSIS — R202 Paresthesia of skin: Secondary | ICD-10-CM

## 2014-06-03 NOTE — Addendum Note (Signed)
Addended by: Sarina Ill B on: 06/03/2014 11:06 AM   Modules accepted: Orders

## 2015-08-05 ENCOUNTER — Encounter (HOSPITAL_COMMUNITY): Payer: Self-pay | Admitting: Emergency Medicine

## 2015-08-05 ENCOUNTER — Emergency Department (HOSPITAL_COMMUNITY)
Admission: EM | Admit: 2015-08-05 | Discharge: 2015-08-06 | Disposition: A | Discharge status: Home / Self Care | Payer: Self-pay | Attending: Emergency Medicine | Admitting: Emergency Medicine

## 2015-08-05 ENCOUNTER — Emergency Department (HOSPITAL_COMMUNITY): Payer: Self-pay

## 2015-08-05 DIAGNOSIS — J45909 Unspecified asthma, uncomplicated: Secondary | ICD-10-CM | POA: Insufficient documentation

## 2015-08-05 DIAGNOSIS — Z8669 Personal history of other diseases of the nervous system and sense organs: Secondary | ICD-10-CM | POA: Insufficient documentation

## 2015-08-05 DIAGNOSIS — Z79899 Other long term (current) drug therapy: Secondary | ICD-10-CM | POA: Insufficient documentation

## 2015-08-05 DIAGNOSIS — R000 Tachycardia, unspecified: Secondary | ICD-10-CM | POA: Insufficient documentation

## 2015-08-05 DIAGNOSIS — Z792 Long term (current) use of antibiotics: Secondary | ICD-10-CM | POA: Insufficient documentation

## 2015-08-05 DIAGNOSIS — Z3202 Encounter for pregnancy test, result negative: Secondary | ICD-10-CM | POA: Insufficient documentation

## 2015-08-05 DIAGNOSIS — B349 Viral infection, unspecified: Secondary | ICD-10-CM

## 2015-08-05 DIAGNOSIS — Z8742 Personal history of other diseases of the female genital tract: Secondary | ICD-10-CM | POA: Insufficient documentation

## 2015-08-05 MED ORDER — SODIUM CHLORIDE 0.9 % IV BOLUS (SEPSIS)
1000.0000 mL | Freq: Once | INTRAVENOUS | Status: AC
  Administered 2015-08-06: 1000 mL via INTRAVENOUS

## 2015-08-05 MED ORDER — ACETAMINOPHEN 325 MG PO TABS
650.0000 mg | ORAL_TABLET | Freq: Once | ORAL | Status: AC
  Administered 2015-08-05: 650 mg via ORAL
  Filled 2015-08-05: qty 2

## 2015-08-05 MED ORDER — ALBUTEROL SULFATE (2.5 MG/3ML) 0.083% IN NEBU
5.0000 mg | INHALATION_SOLUTION | Freq: Once | RESPIRATORY_TRACT | Status: AC
  Administered 2015-08-05: 5 mg via RESPIRATORY_TRACT
  Filled 2015-08-05: qty 6

## 2015-08-05 NOTE — ED Notes (Signed)
Pt c/o shob, chest pressure with coughing, emesis with coughing, body aches, chill, fever onset yesterday. Pt took Ibuprofen 800mg  1830 this evening.

## 2015-08-05 NOTE — ED Provider Notes (Signed)
CSN: [id number]     Arrival date & time 08/05/15  2046 History   First MD Initiated Contact with Patient 08/05/15 2327     Chief Complaint  Patient presents with  . Influenza     (Consider location/radiation/quality/duration/timing/severity/associated sxs/prior Treatment) HPI Comments: Patient presents to the emergency department with chief complaint of fever, body aches, cough, chest tightness. She reports history of asthma and chronic bronchitis. She states that she has been using her inhaler with minimal relief. She has tried taking ibuprofen for fever with minimal relief. She reports some associated nausea and vomiting. She denies chest pain or abdominal pain. There are no other modifying factors. She is uncertain whether she got a flu shot.  The history is provided by the patient. No language interpreter was used.    Past Medical History  Diagnosis Date  . Menorrhagia   . Bronchial asthma   . Cataracts, bilateral    Past Surgical History  Procedure Laterality Date  . Tubal ligation    . Foot surgery      BOTH RIGHT AND LEFT  . Endometrial ablation  03/23/10  . Knee arthroscopy  2002    right knee   Family History  Problem Relation Age of Onset  . Diabetes Mother   . Hypertension Mother   . Cancer Mother   . Hypertension Father   . Cancer Father     COLON  . Diabetes Sister   . Hypertension Sister   . Diabetes Paternal Aunt   . Diabetes Paternal Uncle    Social History  Substance Use Topics  . Smoking status: Never Smoker   . Smokeless tobacco: Never Used  . Alcohol Use: No   OB History    Gravida Para Term Preterm AB TAB SAB Ectopic Multiple Living   2 2        2      Review of Systems  Constitutional: Positive for fever and chills.  HENT: Positive for postnasal drip, rhinorrhea, sinus pressure, sneezing and sore throat.   Respiratory: Positive for cough. Negative for shortness of breath.   Cardiovascular: Negative for chest pain.  Gastrointestinal:  Negative for nausea, vomiting, abdominal pain, diarrhea and constipation.  Genitourinary: Negative for dysuria.  All other systems reviewed and are negative.     Allergies  Review of patient's allergies indicates no known allergies.  Home Medications   Prior to Admission medications   Medication Sig Start Date End Date Taking? Authorizing Provider  albuterol (PROVENTIL HFA;VENTOLIN HFA) 108 (90 BASE) MCG/ACT inhaler Inhale 2 puffs into the lungs every 4 (four) hours as needed for wheezing. 12/15/13   Fransico Meadow, PA-C  azithromycin (ZITHROMAX) 250 MG tablet Take 1 tablet (250 mg total) by mouth daily. Take first 2 tablets together, then 1 every day until finished. 12/15/13   Fransico Meadow, PA-C  montelukast (SINGULAIR) 10 MG tablet Take 10 mg by mouth at bedtime.    Historical Provider, MD  sulfamethoxazole-trimethoprim (BACTRIM DS) 800-160 MG per tablet Take 1 tablet by mouth 2 (two) times daily. 03/01/14   Huel Cote, NP  triamterene-hydrochlorothiazide (DYAZIDE) 37.5-25 MG per capsule Take 1 capsule by mouth every morning.    Historical Provider, MD   BP 162/92 mmHg  Pulse 120  Temp(Src) 102.7 F (39.3 C) (Oral)  Resp 18  Ht 5\' 10"  (1.778 m)  Wt 117.935 kg  BMI 37.31 kg/m2  SpO2 97%  LMP 02/11/2014 Physical Exam  Constitutional: She appears well-developed and well-nourished. No distress.  HENT:  Head: Normocephalic.  Right Ear: External ear normal.  Left Ear: External ear normal.  Mildly erythematous, no tonsillar exudate, no abscess, no stridor, uvula is midline  TMs clear bilaterally  Eyes: Conjunctivae and EOM are normal. Pupils are equal, round, and reactive to light.  Neck: Normal range of motion. Neck supple.  Cardiovascular: Regular rhythm and normal heart sounds.  Exam reveals no gallop and no friction rub.   No murmur heard. tachycardic  Pulmonary/Chest: Effort normal and breath sounds normal. No stridor. No respiratory distress. She has no wheezes. She has  no rales. She exhibits no tenderness.  CTAB  Abdominal: Soft. Bowel sounds are normal. She exhibits no distension. There is no tenderness.  No focal abdominal tenderness, no RLQ tenderness or pain at McBurney's point, no RUQ tenderness or Murphy's sign, no left-sided abdominal tenderness, no fluid wave, or signs of peritonitis   Musculoskeletal: Normal range of motion. She exhibits no tenderness.  Neurological: She is alert.  Skin: Skin is warm and dry. No rash noted. She is not diaphoretic.  Psychiatric: She has a normal mood and affect. Her behavior is normal. Judgment and thought content normal.  Nursing note and vitals reviewed.   ED Course  Procedures (including critical care time) Labs Review Labs Reviewed  INFLUENZA PANEL BY PCR (TYPE A & B, H1N1)  URINALYSIS, ROUTINE W REFLEX MICROSCOPIC (NOT AT [facility])  POC URINE PREG, ED    Imaging Review Dg Chest 2 View  08/05/2015  CLINICAL DATA:  55 year old female with shortness of breath, fever and cough EXAM: CHEST  2 VIEW COMPARISON:  Radiograph dated 12/15/2013 FINDINGS: The heart size and mediastinal contours are within normal limits. Both lungs are clear. The visualized skeletal structures are unremarkable. IMPRESSION: No active cardiopulmonary disease. Electronically Signed   By: Anner Crete M.D.   On: 08/05/2015 22:06   I have personally reviewed and evaluated these images and lab results as part of my medical decision-making.    MDM   Final diagnoses:  Viral syndrome    Patient with viral syndrome, possibly influenza. She has been having cough, fever, body aches, some nausea. Discussed influenza treatment, patient does not want to try Tamiflu at this time, stating that she has tried in the past at work. I will give her a prescription for cough medicine and refill her asthma medications. Her fever has reduced nicely and heart rate has also slowed in the ED. She is feeling improved, but still has some headache. Will  discharge to home. Patient understands agrees the plan. He is stable for discharge.    Montine Circle, PA-C 08/06/15 0042  Veatrice Kells, MD 08/06/15 [phone]

## 2015-08-06 LAB — POC URINE PREG, ED: Preg Test, Ur: NEGATIVE

## 2015-08-06 LAB — URINALYSIS, ROUTINE W REFLEX MICROSCOPIC
BILIRUBIN URINE: NEGATIVE
GLUCOSE, UA: NEGATIVE mg/dL
Ketones, ur: NEGATIVE mg/dL
Leukocytes, UA: NEGATIVE
Nitrite: NEGATIVE
PH: 5.5 (ref 5.0–8.0)
Protein, ur: NEGATIVE mg/dL
SPECIFIC GRAVITY, URINE: 1.006 (ref 1.005–1.030)

## 2015-08-06 LAB — INFLUENZA PANEL BY PCR (TYPE A & B)
H1N1 flu by pcr: NOT DETECTED
INFLAPCR: POSITIVE — AB
INFLBPCR: NEGATIVE

## 2015-08-06 LAB — URINE MICROSCOPIC-ADD ON
BACTERIA UA: NONE SEEN
WBC, UA: NONE SEEN WBC/hpf (ref 0–5)

## 2015-08-06 MED ORDER — MONTELUKAST SODIUM 10 MG PO TABS: 10.0000 mg | ORAL_TABLET | Freq: Every day | ORAL | Status: DC

## 2015-08-06 MED ORDER — HYDROCODONE-HOMATROPINE 5-1.5 MG/5ML PO SYRP: 5.0000 mL | ORAL_SOLUTION | Freq: Four times a day (QID) | ORAL | Status: DC | PRN

## 2015-08-06 MED ORDER — ALBUTEROL SULFATE HFA 108 (90 BASE) MCG/ACT IN AERS: 2.0000 | INHALATION_SPRAY | RESPIRATORY_TRACT | Status: DC | PRN

## 2015-08-06 MED ORDER — FLUTICASONE FUROATE-VILANTEROL 200-25 MCG/INH IN AEPB: 1.0000 | INHALATION_SPRAY | Freq: Every day | RESPIRATORY_TRACT | Status: DC

## 2015-08-06 MED ORDER — BENZONATATE 100 MG PO CAPS
200.0000 mg | ORAL_CAPSULE | ORAL | Status: AC
  Administered 2015-08-06: 200 mg via ORAL
  Filled 2015-08-06: qty 2

## 2015-08-06 NOTE — Discharge Instructions (Signed)
Viral Infections A viral infection can be caused by different types of viruses.Most viral infections are not serious and resolve on their own. However, some infections may cause severe symptoms and may lead to further complications. SYMPTOMS Viruses can frequently cause:  Minor sore throat.  Aches and pains.  Headaches.  Runny nose.  Different types of rashes.  Watery eyes.  Tiredness.  Cough.  Loss of appetite.  Gastrointestinal infections, resulting in nausea, vomiting, and diarrhea. These symptoms do not respond to antibiotics because the infection is not caused by bacteria. However, you might catch a bacterial infection following the viral infection. This is sometimes called a "superinfection." Symptoms of such a bacterial infection may include:  Worsening sore throat with pus and difficulty swallowing.  Swollen neck glands.  Chills and a high or persistent fever.  Severe headache.  Tenderness over the sinuses.  Persistent overall ill feeling (malaise), muscle aches, and tiredness (fatigue).  Persistent cough.  Yellow, green, or brown mucus production with coughing. HOME CARE INSTRUCTIONS   Only take over-the-counter or prescription medicines for pain, discomfort, diarrhea, or fever as directed by your caregiver.  Drink enough water and fluids to keep your urine clear or pale yellow. Sports drinks can provide valuable electrolytes, sugars, and hydration.  Get plenty of rest and maintain proper nutrition. Soups and broths with crackers or rice are fine. SEEK IMMEDIATE MEDICAL CARE IF:   You have severe headaches, shortness of breath, chest pain, neck pain, or an unusual rash.  You have uncontrolled vomiting, diarrhea, or you are unable to keep down fluids.  You or your child has an oral temperature above 102 F (38.9 C), not controlled by medicine.  Your baby is older than 3 months with a rectal temperature of 102 F (38.9 C) or higher.  Your baby is 56  months old or younger with a rectal temperature of 100.4 F (38 C) or higher. MAKE SURE YOU:   Understand these instructions.  Will watch your condition.  Will get help right away if you are not doing well or get worse.   This information is not intended to replace advice given to you by your health care provider. Make sure you discuss any questions you have with your health care provider.   Document Released: 03/13/2005 Document Revised: 08/26/2011 Document Reviewed: 11/09/2014 Elsevier Interactive Patient Education 2016 [employer] Influenza, Adult Influenza ("the flu") is a viral infection of the respiratory tract. It occurs more often in winter months because people spend more time in close contact with one another. Influenza can make you feel very sick. Influenza easily spreads from person to person (contagious). CAUSES  Influenza is caused by a virus that infects the respiratory tract. You can catch the virus by breathing in droplets from an infected person's cough or sneeze. You can also catch the virus by touching something that was recently contaminated with the virus and then touching your mouth, nose, or eyes. RISKS AND COMPLICATIONS You may be at risk for a more severe case of influenza if you smoke cigarettes, have diabetes, have chronic heart disease (such as heart failure) or lung disease (such as asthma), or if you have a weakened immune system. Elderly people and pregnant women are also at risk for more serious infections. The most common problem of influenza is a lung infection (pneumonia). Sometimes, this problem can require emergency medical care and may be life threatening. SIGNS AND SYMPTOMS  Symptoms typically last 4 to 10 days and may include:  Fever.  Chills.  Headache, body aches, and muscle aches.  Sore throat.  Chest discomfort and cough.  Poor appetite.  Weakness or feeling tired.  Dizziness.  Nausea or vomiting. DIAGNOSIS  Diagnosis of  influenza is often made based on your history and a physical exam. A nose or throat swab test can be done to confirm the diagnosis. TREATMENT  In mild cases, influenza goes away on its own. Treatment is directed at relieving symptoms. For more severe cases, your health care provider may prescribe antiviral medicines to shorten the sickness. Antibiotic medicines are not effective because the infection is caused by a virus, not by bacteria. HOME CARE INSTRUCTIONS  Take medicines only as directed by your health care provider.  Use a cool mist humidifier to make breathing easier.  Get plenty of rest until your temperature returns to normal. This usually takes 3 to 4 days.  Drink enough fluid to keep your urine clear or pale yellow.  Cover yourmouth and nosewhen coughing or sneezing,and wash your handswellto prevent thevirusfrom spreading.  Stay homefromwork orschool untilthe fever is gonefor at least 6full day. PREVENTION  An annual influenza vaccination (flu shot) is the best way to avoid getting influenza. An annual flu shot is now routinely recommended for all adults in the [place] IF:  You experiencechest pain, yourcough worsens,or you producemore mucus.  Youhave nausea,vomiting, ordiarrhea.  Your fever returns or gets worse. SEEK IMMEDIATE MEDICAL CARE IF:  You havetrouble breathing, you become short of breath,or your skin ornails becomebluish.  You have severe painor stiffnessin the neck.  You develop a sudden headache, or pain in the face or ear.  You have nausea or vomiting that you cannot control. MAKE SURE YOU:   Understand these instructions.  Will watch your condition.  Will get help right away if you are not doing well or get worse.   This information is not intended to replace advice given to you by your health care provider. Make sure you discuss any questions you have with your health care provider.   Document Released:  05/31/2000 Document Revised: 06/24/2014 Document Reviewed: 09/02/2011 Elsevier Interactive Patient Education [employer].

## 2015-12-01 ENCOUNTER — Encounter: Payer: No Typology Code available for payment source | Admitting: Women's Health

## 2016-05-24 ENCOUNTER — Encounter (HOSPITAL_COMMUNITY): Payer: Self-pay | Admitting: *Deleted

## 2016-05-24 ENCOUNTER — Emergency Department (HOSPITAL_COMMUNITY)
Admission: EM | Admit: 2016-05-24 | Discharge: 2016-05-24 | Disposition: A | Discharge status: Home / Self Care | Payer: Self-pay | Attending: Emergency Medicine | Admitting: Emergency Medicine

## 2016-05-24 DIAGNOSIS — T148XXA Other injury of unspecified body region, initial encounter: Secondary | ICD-10-CM

## 2016-05-24 DIAGNOSIS — S29012A Strain of muscle and tendon of back wall of thorax, initial encounter: Secondary | ICD-10-CM | POA: Insufficient documentation

## 2016-05-24 DIAGNOSIS — X58XXXA Exposure to other specified factors, initial encounter: Secondary | ICD-10-CM | POA: Insufficient documentation

## 2016-05-24 DIAGNOSIS — Y939 Activity, unspecified: Secondary | ICD-10-CM | POA: Insufficient documentation

## 2016-05-24 DIAGNOSIS — M546 Pain in thoracic spine: Secondary | ICD-10-CM

## 2016-05-24 DIAGNOSIS — M62830 Muscle spasm of back: Secondary | ICD-10-CM

## 2016-05-24 DIAGNOSIS — Y999 Unspecified external cause status: Secondary | ICD-10-CM | POA: Insufficient documentation

## 2016-05-24 DIAGNOSIS — Y929 Unspecified place or not applicable: Secondary | ICD-10-CM | POA: Insufficient documentation

## 2016-05-24 DIAGNOSIS — I10 Essential (primary) hypertension: Secondary | ICD-10-CM | POA: Insufficient documentation

## 2016-05-24 HISTORY — DX: Pure hypercholesterolemia, unspecified: E78.00

## 2016-05-24 HISTORY — DX: Essential (primary) hypertension: I10

## 2016-05-24 LAB — URINALYSIS, ROUTINE W REFLEX MICROSCOPIC
BILIRUBIN URINE: NEGATIVE
Glucose, UA: NEGATIVE mg/dL
KETONES UR: NEGATIVE mg/dL
LEUKOCYTES UA: NEGATIVE
NITRITE: NEGATIVE
PH: 7 (ref 5.0–8.0)
Protein, ur: NEGATIVE mg/dL
SPECIFIC GRAVITY, URINE: 1.019 (ref 1.005–1.030)

## 2016-05-24 MED ORDER — CYCLOBENZAPRINE HCL 10 MG PO TABS: 10.0000 mg | ORAL_TABLET | Freq: Three times a day (TID) | ORAL | 0 refills | Status: AC | PRN

## 2016-05-24 MED ORDER — NAPROXEN 500 MG PO TABS
500.0000 mg | ORAL_TABLET | Freq: Once | ORAL | Status: AC
  Administered 2016-05-24: 500 mg via ORAL
  Filled 2016-05-24: qty 1

## 2016-05-24 MED ORDER — NAPROXEN 500 MG PO TABS: 500.0000 mg | ORAL_TABLET | Freq: Two times a day (BID) | ORAL | 0 refills | Status: DC | PRN

## 2016-05-24 NOTE — ED Triage Notes (Signed)
Patient is alert and oriented x4.  She is complaining of mid to lower back pain that started on Monday.  Patient has been taking OTC medications with no relief.  Currently she rates her pain 8 of 10.

## 2016-05-24 NOTE — Discharge Instructions (Signed)
Back Pain: °Your back pain should be treated with medicines such as ibuprofen or aleve and this back pain should get better over the next 2 weeks.  However if you develop severe or worsening pain, low back pain with fever, numbness, weakness or inability to walk or urinate, you should return to the ER immediately.  Please follow up with your doctor this week for a recheck if still having symptoms.  Avoid heavy lifting over 10 pounds over the next two weeks. ° °Low back pain is discomfort in the lower back that may be due to injuries to muscles and ligaments around the spine.  Occasionally, it may be caused by a a problem to a part of the spine called a disc.  The pain may last several days or a week;  However, most patients get completely well in 4 weeks. ° °Self - care:  The application of heat can help soothe the pain.  Maintaining your daily activities, including walking, is encourged, as it will help you get better faster than just staying in bed. Perform gentle stretching as discussed. Drink plenty of fluids. ° °Medications are also useful to help with pain control.  A commonly prescribed medication includes tylenol. Take over the counter tylenol as instructed on the bottle. ° °Non steroidal anti inflammatory medications including Ibuprofen and naproxen;  These medications help both pain and swelling and are very useful in treating back pain.  They should be taken with food, as they can cause stomach upset, and more seriously, stomach bleeding.   ° °Muscle relaxants:  These medications can help with muscle tightness that is a cause of lower back pain.  Most of these medications can cause drowsiness, and it is not safe to drive or use dangerous machinery while taking them. ° °SEEK IMMEDIATE MEDICAL ATTENTION IF: °New numbness, tingling, weakness, or problem with the use of your arms or legs.  °Severe back pain not relieved with medications.  °Difficulty with or loss of control of your bowel or bladder control.    °Increasing pain in any areas of the body (such as chest or abdominal pain).  °Shortness of breath, dizziness or fainting.  °Nausea (feeling sick to your stomach), vomiting, fever, or sweats. ° °You will need to follow up with  Your primary healthcare provider in 1-2 weeks for reassessment. °

## 2016-05-24 NOTE — ED Provider Notes (Signed)
[facility] DEPT Provider Note   CSN: [id number] Arrival date & time: 05/24/16  [id number]  By signing my name below, I, Rayna Sexton, attest that this documentation has been prepared under the direction and in the presence of Micholas Drumwright Camprubi-Soms, PA-C. Electronically Signed: Rayna Sexton, ED Scribe. 05/24/16. 10:52 AM.   History   Chief Complaint Chief Complaint  Patient presents with  . Back Pain    HPI HPI Comments: Aliyanah Coney is a 55 y.o. female who presents to the Emergency Department complaining of 8/10, positionally intermittent, sharp, non radiating, atraumatic, right mid back pain x 4 days. She states her pain feels like a "pulled muscle", and when lying down it becomes a "tingling feeling" in her R mid back. Pt reports an associated tingling sensation in her right thigh which has since resolved. Her pain worsens when lying or sitting and alleviates with ambulation. She initially thought her pain was due to gas so she took Gas X and an OTC laxative w/o relief and then ibuprofen w/o relief. Pt denies a h/o CA or IVDA. Denies heavy lifting/twisting/bending, or injury.  She denies fevers, chills, CP, SOB, abdominal pain, nausea, vomiting, diarrhea, constipation, urinary frequency, hematuria, dysuria, vaginal bleeding, vaginal discharge, numbness, ongoing tingling, bowel or bladder incontinence, saddle anesthesia or cauda equina symptoms, and weakness.   The history is provided by the patient and medical records. No language interpreter was used.  Back Pain   This is a new problem. The current episode started more than 2 days ago. The problem occurs constantly. The problem has not changed since onset.The pain is associated with no known injury. The pain is present in the lumbar spine. Quality: sharp. The pain does not radiate. The pain is at a severity of 8/10. The pain is moderate. The symptoms are aggravated by certain positions. The pain is the same all the time. Pertinent  negatives include no chest pain, no fever, no numbness, no abdominal pain, no bowel incontinence, no perianal numbness, no bladder incontinence, no dysuria, no paresthesias, no paresis, no tingling and no weakness. She has tried NSAIDs for the symptoms. The treatment provided no relief. Risk factors include obesity.    Past Medical History:  Diagnosis Date  . Bronchial asthma   . Cataracts, bilateral   . Hypercholesterolemia   . Hypertension   . Menorrhagia     Patient Active Problem List   Diagnosis Date Noted  . Obesity, morbid ([place]) 03/10/2013  . Fibroid uterus 09/16/2012  . Bronchial asthma   . Cataracts, bilateral     Past Surgical History:  Procedure Laterality Date  . ENDOMETRIAL ABLATION  03/23/10  . FOOT SURGERY     BOTH RIGHT AND LEFT  . KNEE ARTHROSCOPY  2002   right knee  . TUBAL LIGATION      OB History    Gravida Para Term Preterm AB Living   2 2       2    SAB TAB Ectopic Multiple Live Births                   Home Medications    Prior to Admission medications   Medication Sig Start Date End Date Taking? Authorizing Provider  albuterol (PROVENTIL HFA;VENTOLIN HFA) 108 (90 Base) MCG/ACT inhaler Inhale 2 puffs into the lungs every 4 (four) hours as needed for wheezing. 08/06/15   Montine Circle, PA-C  azithromycin (ZITHROMAX) 250 MG tablet Take 1 tablet (250 mg total) by mouth daily. Take first 2 tablets together, then  1 every day until finished. Patient not taking: Reported on 08/06/2015 12/15/13   Fransico Meadow, PA-C  fluticasone furoate-vilanterol (BREO ELLIPTA) 200-25 MCG/INH AEPB Inhale 1 puff into the lungs daily. 08/06/15   Montine Circle, PA-C  HYDROcodone-homatropine [facility]) 5-1.5 MG/5ML syrup Take 5 mLs by mouth every 6 (six) hours as needed for cough. 08/06/15   Montine Circle, PA-C  montelukast (SINGULAIR) 10 MG tablet Take 1 tablet (10 mg total) by mouth daily. 08/06/15   Montine Circle, PA-C  sulfamethoxazole-trimethoprim (BACTRIM DS) 800-160  MG per tablet Take 1 tablet by mouth 2 (two) times daily. Patient not taking: Reported on 08/06/2015 03/01/14   Huel Cote, NP    Family History Family History  Problem Relation Age of Onset  . Hypertension Father   . Cancer Father     COLON  . Diabetes Mother   . Hypertension Mother   . Cancer Mother   . Diabetes Sister   . Hypertension Sister   . Diabetes Paternal Aunt   . Diabetes Paternal Uncle     Social History Social History  Substance Use Topics  . Smoking status: Never Smoker  . Smokeless tobacco: Never Used  . Alcohol use No     Allergies   Patient has no known allergies.   Review of Systems Review of Systems  Constitutional: Negative for chills and fever.  Respiratory: Negative for shortness of breath.   Cardiovascular: Negative for chest pain.  Gastrointestinal: Negative for abdominal pain, bowel incontinence, constipation, diarrhea, nausea and vomiting.  Genitourinary: Negative for bladder incontinence, dysuria, frequency, hematuria, vaginal bleeding and vaginal discharge.  Musculoskeletal: Positive for back pain. Negative for arthralgias and myalgias.  Skin: Negative for color change.  Allergic/Immunologic: Negative for immunocompromised state.  Neurological: Negative for tingling, weakness, numbness and paresthesias.  Psychiatric/Behavioral: Negative for confusion.   A complete 10 system review of systems was obtained and all systems are negative except as noted in the HPI and PMH.    Physical Exam Updated Vital Signs BP 155/96 (BP Location: Right Arm)   Pulse 69   Temp 98.3 F (36.8 C) (Oral)   Resp 18   Ht 5\' 10"  (1.778 m)   Wt 265 lb (120.2 kg)   LMP 02/11/2014   SpO2 97%   BMI 38.02 kg/m   Physical Exam  Constitutional: She is oriented to person, place, and time. Vital signs are normal. She appears well-developed and well-nourished.  Non-toxic appearance. No distress.  Afebrile, nontoxic, NAD  HENT:  Head: Normocephalic and  atraumatic.  Mouth/Throat: Oropharynx is clear and moist and mucous membranes are normal.  Eyes: Conjunctivae and EOM are normal. Right eye exhibits no discharge. Left eye exhibits no discharge.  Neck: Normal range of motion. Neck supple.  Cardiovascular: Normal rate, regular rhythm, normal heart sounds and intact distal pulses.  Exam reveals no gallop and no friction rub.   No murmur heard. Pulmonary/Chest: Effort normal and breath sounds normal. No respiratory distress. She has no decreased breath sounds. She has no wheezes. She has no rhonchi. She has no rales.  Abdominal: Soft. Normal appearance and bowel sounds are normal. She exhibits no distension. There is no tenderness. There is no rigidity, no rebound, no guarding, no CVA tenderness, no tenderness at McBurney's point and negative Murphy's sign.  Soft, NTND, +BS throughout, no r/g/r, neg murphy's, neg mcburney's, no CVA TTP although tenderness to R thoracic region near the CVA area  Musculoskeletal: Normal range of motion.  Thoracic back: She exhibits tenderness and spasm. She exhibits normal range of motion, no bony tenderness and no deformity.       Back:  Throacic spine with FROM intact without spinous process TTP, no bony stepoffs or deformities, with mild right sided flank/paraspinous muscle TTP and muscle spasms. Strength and sensation grossly intact in all extremities, gait steady and nonantalgic. No overlying skin changes. Distal pulses intact.   Neurological: She is alert and oriented to person, place, and time. She has normal strength. No sensory deficit.  Skin: Skin is warm, dry and intact. No rash noted.  Psychiatric: She has a normal mood and affect.  Nursing note and vitals reviewed.  ED Treatments / Results  Labs (all labs ordered are listed, but only abnormal results are displayed) Labs Reviewed  URINALYSIS, ROUTINE W REFLEX MICROSCOPIC - Abnormal; Notable for the following:       Result Value   APPearance HAZY  (*)    Hgb urine dipstick SMALL (*)    Bacteria, UA RARE (*)    Squamous Epithelial / LPF 6-30 (*)    All other components within normal limits    EKG  EKG Interpretation None       Radiology No results found.  Procedures Procedures  DIAGNOSTIC STUDIES: Oxygen Saturation is 97% on RA, normal by my interpretation.    COORDINATION OF CARE: 10:52 AM Discussed next steps with pt. Pt verbalized understanding and is agreeable with the plan.    Medications Ordered in ED Medications  naproxen (NAPROSYN) tablet 500 mg (500 mg Oral Given 05/24/16 1116)     Initial Impression / Assessment and Plan / ED Course  I have reviewed the triage vital signs and the nursing notes.  Pertinent labs & imaging results that were available during my care of the patient were reviewed by me and considered in my medical decision making (see chart for details).  Clinical Course     55 y.o. female here with R flank/thoracic back pain that she states feels like a pulled muscle. No urinary complaints, but tenderness right over the flank area. Mild spasm noted. No s/sx of cauda equina or cord compression, no red flag signs. No midline spinal TTP. Doubt need for imaging, but will obtain U/A then reassess shortly. Will give naprosyn now.   12:32 PM U/A with small hgb but 6-30 squamous so likely contaminated, 0-5 RBC and WBC but rare bacteria; doubt UTI or kidney stone. Pain likely muscular. NSAIDs and muscle relaxant given. F/up with PCP in 1wk. Heat use discussed. I explained the diagnosis and have given explicit precautions to return to the ER including for any other new or worsening symptoms. The patient understands and accepts the medical plan as it's been dictated and I have answered their questions. Discharge instructions concerning home care and prescriptions have been given. The patient is STABLE and is discharged to home in good condition.   I personally performed the services described in this  documentation, which was scribed in my presence. The recorded information has been reviewed and is accurate.   Final Clinical Impressions(s) / ED Diagnoses   Final diagnoses:  Acute right-sided thoracic back pain  Muscle spasm of back  Muscle strain    New Prescriptions New Prescriptions   CYCLOBENZAPRINE (FLEXERIL) 10 MG TABLET    Take 1 tablet (10 mg total) by mouth 3 (three) times daily as needed for muscle spasms.   NAPROXEN (NAPROSYN) 500 MG TABLET    Take 1 tablet (500 mg  total) by mouth 2 (two) times daily as needed for mild pain, moderate pain or headache (TAKE WITH MEALS.).     Joycelyn Liska Camprubi-Soms, PA-C 05/24/16 [place], MD 05/26/16 [phone]

## 2016-05-24 NOTE — ED Notes (Signed)
Bed: WTR8 Expected date:  Expected time:  Means of arrival:  Comments: 

## 2016-05-24 NOTE — ED Notes (Signed)
Patient is alert and oriented x3.  She was given DC instructions and follow up visit instructions.  Patient gave verbal understanding. She was DC ambulatory under her own power to home.  V/S stable.  He was not showing any signs of distress on DC 

## 2016-07-23 DIAGNOSIS — M545 Low back pain: Secondary | ICD-10-CM | POA: Diagnosis not present

## 2016-07-23 DIAGNOSIS — M1711 Unilateral primary osteoarthritis, right knee: Secondary | ICD-10-CM | POA: Diagnosis not present

## 2016-07-26 DIAGNOSIS — M545 Low back pain: Secondary | ICD-10-CM | POA: Diagnosis not present

## 2016-07-27 DIAGNOSIS — Z23 Encounter for immunization: Secondary | ICD-10-CM | POA: Diagnosis not present

## 2016-08-28 DIAGNOSIS — Z1231 Encounter for screening mammogram for malignant neoplasm of breast: Secondary | ICD-10-CM | POA: Diagnosis not present

## 2016-09-18 ENCOUNTER — Encounter: Payer: No Typology Code available for payment source | Admitting: Women's Health

## 2016-09-25 DIAGNOSIS — R635 Abnormal weight gain: Secondary | ICD-10-CM | POA: Diagnosis not present

## 2016-09-25 DIAGNOSIS — N951 Menopausal and female climacteric states: Secondary | ICD-10-CM | POA: Diagnosis not present

## 2016-09-27 DIAGNOSIS — E782 Mixed hyperlipidemia: Secondary | ICD-10-CM | POA: Diagnosis not present

## 2016-09-27 DIAGNOSIS — E539 Vitamin B deficiency, unspecified: Secondary | ICD-10-CM | POA: Diagnosis not present

## 2016-09-27 DIAGNOSIS — E559 Vitamin D deficiency, unspecified: Secondary | ICD-10-CM | POA: Diagnosis not present

## 2016-09-27 DIAGNOSIS — N951 Menopausal and female climacteric states: Secondary | ICD-10-CM | POA: Diagnosis not present

## 2016-09-27 DIAGNOSIS — E6609 Other obesity due to excess calories: Secondary | ICD-10-CM | POA: Diagnosis not present

## 2016-10-02 DIAGNOSIS — E538 Deficiency of other specified B group vitamins: Secondary | ICD-10-CM | POA: Diagnosis not present

## 2016-10-02 DIAGNOSIS — E782 Mixed hyperlipidemia: Secondary | ICD-10-CM | POA: Diagnosis not present

## 2016-10-02 DIAGNOSIS — E559 Vitamin D deficiency, unspecified: Secondary | ICD-10-CM | POA: Diagnosis not present

## 2016-10-02 DIAGNOSIS — E6609 Other obesity due to excess calories: Secondary | ICD-10-CM | POA: Diagnosis not present

## 2016-10-09 ENCOUNTER — Ambulatory Visit (INDEPENDENT_AMBULATORY_CARE_PROVIDER_SITE_OTHER): Payer: BLUE CROSS/BLUE SHIELD | Admitting: Women's Health

## 2016-10-09 ENCOUNTER — Encounter: Payer: Self-pay | Admitting: Women's Health

## 2016-10-09 VITALS — BP 136/82 | Ht 70.0 in | Wt 268.0 lb

## 2016-10-09 DIAGNOSIS — E782 Mixed hyperlipidemia: Secondary | ICD-10-CM | POA: Diagnosis not present

## 2016-10-09 DIAGNOSIS — Z1151 Encounter for screening for human papillomavirus (HPV): Secondary | ICD-10-CM

## 2016-10-09 DIAGNOSIS — Z113 Encounter for screening for infections with a predominantly sexual mode of transmission: Secondary | ICD-10-CM

## 2016-10-09 DIAGNOSIS — Z01419 Encounter for gynecological examination (general) (routine) without abnormal findings: Secondary | ICD-10-CM | POA: Diagnosis not present

## 2016-10-09 DIAGNOSIS — E6609 Other obesity due to excess calories: Secondary | ICD-10-CM | POA: Diagnosis not present

## 2016-10-09 DIAGNOSIS — E538 Deficiency of other specified B group vitamins: Secondary | ICD-10-CM | POA: Diagnosis not present

## 2016-10-09 DIAGNOSIS — E559 Vitamin D deficiency, unspecified: Secondary | ICD-10-CM | POA: Diagnosis not present

## 2016-10-09 NOTE — Patient Instructions (Signed)
Colonoscopy  Dr Carlean Purl  [place] Maintenance for Postmenopausal Women Menopause is a normal process in which your reproductive ability comes to an end. This process happens gradually over a span of months to years, usually between the ages of 22 and 39. Menopause is complete when you have missed 12 consecutive menstrual periods. It is important to talk with your health care provider about some of the most common conditions that affect postmenopausal women, such as heart disease, cancer, and bone loss (osteoporosis). Adopting a healthy lifestyle and getting preventive care can help to promote your health and wellness. Those actions can also lower your chances of developing some of these common conditions. What should I know about menopause? During menopause, you may experience a number of symptoms, such as:  Moderate-to-severe hot flashes.  Night sweats.  Decrease in sex drive.  Mood swings.  Headaches.  Tiredness.  Irritability.  Memory problems.  Insomnia. Choosing to treat or not to treat menopausal changes is an individual decision that you make with your health care provider. What should I know about hormone replacement therapy and supplements? Hormone therapy products are effective for treating symptoms that are associated with menopause, such as hot flashes and night sweats. Hormone replacement carries certain risks, especially as you become older. If you are thinking about using estrogen or estrogen with progestin treatments, discuss the benefits and risks with your health care provider. What should I know about heart disease and stroke? Heart disease, heart attack, and stroke become more likely as you age. This may be due, in part, to the hormonal changes that your body experiences during menopause. These can affect how your body processes dietary fats, triglycerides, and cholesterol. Heart attack and stroke are both medical emergencies. There are many things that you  can do to help prevent heart disease and stroke:  Have your blood pressure checked at least every 1-2 years. High blood pressure causes heart disease and increases the risk of stroke.  If you are 87-71 years old, ask your health care provider if you should take aspirin to prevent a heart attack or a stroke.  Do not use any tobacco products, including cigarettes, chewing tobacco, or electronic cigarettes. If you need help quitting, ask your health care provider.  It is important to eat a healthy diet and maintain a healthy weight.  Be sure to include plenty of vegetables, fruits, low-fat dairy products, and lean protein.  Avoid eating foods that are high in solid fats, added sugars, or salt (sodium).  Get regular exercise. This is one of the most important things that you can do for your health.  Try to exercise for at least 150 minutes each week. The type of exercise that you do should increase your heart rate and make you sweat. This is known as moderate-intensity exercise.  Try to do strengthening exercises at least twice each week. Do these in addition to the moderate-intensity exercise.  Know your numbers.Ask your health care provider to check your cholesterol and your blood glucose. Continue to have your blood tested as directed by your health care provider. What should I know about cancer screening? There are several types of cancer. Take the following steps to reduce your risk and to catch any cancer development as early as possible. Breast Cancer  Practice breast self-awareness.  This means understanding how your breasts normally appear and feel.  It also means doing regular breast self-exams. Let your health care provider know about any changes, no matter how  small.  If you are 40 or older, have a clinician do a breast exam (clinical breast exam or CBE) every year. Depending on your age, family history, and medical history, it may be recommended that you also have a yearly  breast X-ray (mammogram).  If you have a family history of breast cancer, talk with your health care provider about genetic screening.  If you are at high risk for breast cancer, talk with your health care provider about having an MRI and a mammogram every year.  Breast cancer (BRCA) gene test is recommended for women who have family members with BRCA-related cancers. Results of the assessment will determine the need for genetic counseling and BRCA1 and for BRCA2 testing. BRCA-related cancers include these types:  Breast. This occurs in males or females.  Ovarian.  Tubal. This may also be called fallopian tube cancer.  Cancer of the abdominal or pelvic lining (peritoneal cancer).  Prostate.  Pancreatic. Cervical, Uterine, and Ovarian Cancer  Your health care provider may recommend that you be screened regularly for cancer of the pelvic organs. These include your ovaries, uterus, and vagina. This screening involves a pelvic exam, which includes checking for microscopic changes to the surface of your cervix (Pap test).  For women ages 21-65, health care providers may recommend a pelvic exam and a Pap test every three years. For women ages 95-65, they may recommend the Pap test and pelvic exam, combined with testing for human papilloma virus (HPV), every five years. Some types of HPV increase your risk of cervical cancer. Testing for HPV may also be done on women of any age who have unclear Pap test results.  Other health care providers may not recommend any screening for nonpregnant women who are considered low risk for pelvic cancer and have no symptoms. Ask your health care provider if a screening pelvic exam is right for you.  If you have had past treatment for cervical cancer or a condition that could lead to cancer, you need Pap tests and screening for cancer for at least 20 years after your treatment. If Pap tests have been discontinued for you, your risk factors (such as having a new  sexual partner) need to be reassessed to determine if you should start having screenings again. Some women have medical problems that increase the chance of getting cervical cancer. In these cases, your health care provider may recommend that you have screening and Pap tests more often.  If you have a family history of uterine cancer or ovarian cancer, talk with your health care provider about genetic screening.  If you have vaginal bleeding after reaching menopause, tell your health care provider.  There are currently no reliable tests available to screen for ovarian cancer. Lung Cancer  Lung cancer screening is recommended for adults 5-11 years old who are at high risk for lung cancer because of a history of smoking. A yearly low-dose CT scan of the lungs is recommended if you:  Currently smoke.  Have a history of at least 30 pack-years of smoking and you currently smoke or have quit within the past 15 years. A pack-year is smoking an average of one pack of cigarettes per day for one year. Yearly screening should:  Continue until it has been 15 years since you quit.  Stop if you develop a health problem that would prevent you from having lung cancer treatment. Colorectal Cancer  This type of cancer can be detected and can often be prevented.  Routine colorectal cancer  screening usually begins at age 66 and continues through age 45.  If you have risk factors for colon cancer, your health care provider may recommend that you be screened at an earlier age.  If you have a family history of colorectal cancer, talk with your health care provider about genetic screening.  Your health care provider may also recommend using home test kits to check for hidden blood in your stool.  A small camera at the end of a tube can be used to examine your colon directly (sigmoidoscopy or colonoscopy). This is done to check for the earliest forms of colorectal cancer.  Direct examination of the colon  should be repeated every 5-10 years until age 25. However, if early forms of precancerous polyps or small growths are found or if you have a family history or genetic risk for colorectal cancer, you may need to be screened more often. Skin Cancer  Check your skin from head to toe regularly.  Monitor any moles. Be sure to tell your health care provider:  About any new moles or changes in moles, especially if there is a change in a mole's shape or color.  If you have a mole that is larger than the size of a pencil eraser.  If any of your family members has a history of skin cancer, especially at a Shamaine Mulkern age, talk with your health care provider about genetic screening.  Always use sunscreen. Apply sunscreen liberally and repeatedly throughout the day.  Whenever you are outside, protect yourself by wearing long sleeves, pants, a wide-brimmed hat, and sunglasses. What should I know about osteoporosis? Osteoporosis is a condition in which bone destruction happens more quickly than new bone creation. After menopause, you may be at an increased risk for osteoporosis. To help prevent osteoporosis or the bone fractures that can happen because of osteoporosis, the following is recommended:  If you are 30-65 years old, get at least 1,000 mg of calcium and at least 600 mg of vitamin D per day.  If you are older than age 44 but younger than age 35, get at least 1,200 mg of calcium and at least 600 mg of vitamin D per day.  If you are older than age 28, get at least 1,200 mg of calcium and at least 800 mg of vitamin D per day. Smoking and excessive alcohol intake increase the risk of osteoporosis. Eat foods that are rich in calcium and vitamin D, and do weight-bearing exercises several times each week as directed by your health care provider. What should I know about how menopause affects my mental health? Depression may occur at any age, but it is more common as you become older. Common symptoms of  depression include:  Low or sad mood.  Changes in sleep patterns.  Changes in appetite or eating patterns.  Feeling an overall lack of motivation or enjoyment of activities that you previously enjoyed.  Frequent crying spells. Talk with your health care provider if you think that you are experiencing depression. What should I know about immunizations? It is important that you get and maintain your immunizations. These include:  Tetanus, diphtheria, and pertussis (Tdap) booster vaccine.  Influenza every year before the flu season begins.  Pneumonia vaccine.  Shingles vaccine. Your health care provider may also recommend other immunizations. This information is not intended to replace advice given to you by your health care provider. Make sure you discuss any questions you have with your health care provider. Document Released: 07/26/2005 Document  Revised: 12/22/2015 Document Reviewed: 03/07/2015 Elsevier Interactive Patient Education  2017 [employer].

## 2016-10-09 NOTE — Progress Notes (Signed)
Kristina Washington 16-Nov-1960 [id number]    History:    Presents for annual exam.  Perimenopausal, regular monthly cycles until March 2017  had cycles in August and again this month. Occasional hot flushes. BTL, her option 2011. Normal Pap and mammogram history. 2017 colonoscopy at [facility] had 2 benign colon polyps 5 year follow-up. Hypertension, hypercholesterolemia managed by primary care. History of several fibroids. Currently working on weight loss with a med loss program, down 10 pounds.  Past medical history, past surgical history, family history and social history were all reviewed and documented in the EPIC chart. Mother, sister diabetes, parents, sister hypertension, father colon cancer. Daughter Lupus doing well, son healthy. Works with an Therapist, art.  ROS:  A ROS was performed and pertinent positives and negatives are included.  Exam:  Vitals:   10/09/16 1140  BP: 136/82  Weight: 268 lb (121.6 kg)  Height: 5\' 10"  (1.778 m)   Body mass index is 38.45 kg/m.   General appearance:  Normal Thyroid:  Symmetrical, normal in size, without palpable masses or nodularity. Respiratory  Auscultation:  Clear without wheezing or rhonchi Cardiovascular  Auscultation:  Regular rate, without rubs, murmurs or gallops  Edema/varicosities:  Not grossly evident Abdominal  Soft,nontender, without masses, guarding or rebound.  Liver/spleen:  No organomegaly noted  Hernia:  None appreciated  Skin  Inspection:  Grossly normal   Breasts: Examined lying and sitting.     Right: Without masses, retractions, discharge or axillary adenopathy.     Left: Without masses, retractions, discharge or axillary adenopathy. Gentitourinary   Inguinal/mons:  Normal without inguinal adenopathy  External genitalia:  Normal  BUS/Urethra/Skene's glands:  Normal  Vagina:  Normal  Cervix:  Normal  Uterus:   normal in size, shape and contour.  Midline and mobile  Adnexa/parametria:     Rt: Without  masses or tenderness.   Lt: Without masses or tenderness.  Anus and perineum: Normal  Digital rectal exam: Normal sphincter tone without palpated masses or tenderness  Assessment/Plan:  56 y.o. SBF G2 P2 for annual exam with no complaints.  Perimenopausal/BTL/her option 2011 Hypertension/hypercholesterolemia-primary care manages labs and meds Obesity  Plan: Congratulated on weight loss will continue with weight loss program. SBE's, annual screening mammogram, instructed to have mammogram sent to our office. Exercise, calcium rich diet, vitamin D 2000 daily encouraged. GC/Chlamydia, declines need for HIV, hepatitis or RPR. Pap with HR HPV typing, new screening guidelines reviewed.    [facility], 1:29 PM 10/09/2016

## 2016-10-10 LAB — URINALYSIS W MICROSCOPIC + REFLEX CULTURE
BACTERIA UA: NONE SEEN [HPF]
BILIRUBIN URINE: NEGATIVE
Casts: NONE SEEN [LPF]
Crystals: NONE SEEN [HPF]
GLUCOSE, UA: NEGATIVE
HGB URINE DIPSTICK: NEGATIVE
KETONES UR: NEGATIVE
LEUKOCYTES UA: NEGATIVE
Nitrite: NEGATIVE
PROTEIN: NEGATIVE
Specific Gravity, Urine: 1.016 (ref 1.001–1.035)
YEAST: NONE SEEN [HPF]
pH: 6 (ref 5.0–8.0)

## 2016-10-10 LAB — GC/CHLAMYDIA PROBE AMP
CT PROBE, AMP APTIMA: NOT DETECTED
GC PROBE AMP APTIMA: NOT DETECTED

## 2016-10-11 LAB — PAP, TP IMAGING W/ HPV RNA, RFLX HPV TYPE 16,18/45: HPV mRNA, High Risk: NOT DETECTED

## 2016-10-11 LAB — URINE CULTURE: ORGANISM ID, BACTERIA: NO GROWTH

## 2016-10-23 DIAGNOSIS — E6609 Other obesity due to excess calories: Secondary | ICD-10-CM | POA: Diagnosis not present

## 2016-10-23 DIAGNOSIS — E782 Mixed hyperlipidemia: Secondary | ICD-10-CM | POA: Diagnosis not present

## 2016-10-23 DIAGNOSIS — R7303 Prediabetes: Secondary | ICD-10-CM | POA: Diagnosis not present

## 2016-10-23 DIAGNOSIS — E538 Deficiency of other specified B group vitamins: Secondary | ICD-10-CM | POA: Diagnosis not present

## 2016-10-30 ENCOUNTER — Encounter: Payer: Self-pay | Admitting: Gynecology

## 2016-10-30 DIAGNOSIS — E559 Vitamin D deficiency, unspecified: Secondary | ICD-10-CM | POA: Diagnosis not present

## 2016-10-30 DIAGNOSIS — J45998 Other asthma: Secondary | ICD-10-CM | POA: Diagnosis not present

## 2016-10-30 DIAGNOSIS — E7800 Pure hypercholesterolemia, unspecified: Secondary | ICD-10-CM | POA: Diagnosis not present

## 2016-10-30 DIAGNOSIS — R609 Edema, unspecified: Secondary | ICD-10-CM | POA: Diagnosis not present

## 2016-11-06 DIAGNOSIS — E538 Deficiency of other specified B group vitamins: Secondary | ICD-10-CM | POA: Diagnosis not present

## 2016-11-06 DIAGNOSIS — R7303 Prediabetes: Secondary | ICD-10-CM | POA: Diagnosis not present

## 2016-11-06 DIAGNOSIS — E6609 Other obesity due to excess calories: Secondary | ICD-10-CM | POA: Diagnosis not present

## 2016-11-06 DIAGNOSIS — E782 Mixed hyperlipidemia: Secondary | ICD-10-CM | POA: Diagnosis not present

## 2017-01-28 DIAGNOSIS — E559 Vitamin D deficiency, unspecified: Secondary | ICD-10-CM | POA: Diagnosis not present

## 2017-01-28 DIAGNOSIS — E7800 Pure hypercholesterolemia, unspecified: Secondary | ICD-10-CM | POA: Diagnosis not present

## 2017-02-05 DIAGNOSIS — I10 Essential (primary) hypertension: Secondary | ICD-10-CM | POA: Diagnosis not present

## 2017-02-05 DIAGNOSIS — J45998 Other asthma: Secondary | ICD-10-CM | POA: Diagnosis not present

## 2017-02-05 DIAGNOSIS — E7800 Pure hypercholesterolemia, unspecified: Secondary | ICD-10-CM | POA: Diagnosis not present

## 2017-03-10 ENCOUNTER — Encounter (HOSPITAL_BASED_OUTPATIENT_CLINIC_OR_DEPARTMENT_OTHER): Payer: Self-pay

## 2017-03-10 ENCOUNTER — Emergency Department (HOSPITAL_BASED_OUTPATIENT_CLINIC_OR_DEPARTMENT_OTHER): Payer: BLUE CROSS/BLUE SHIELD

## 2017-03-10 ENCOUNTER — Emergency Department (HOSPITAL_COMMUNITY): Admission: EM | Admit: 2017-03-10 | Discharge: 2017-03-10 | Discharge status: Against Medical Advice | Payer: Self-pay

## 2017-03-10 ENCOUNTER — Emergency Department (HOSPITAL_BASED_OUTPATIENT_CLINIC_OR_DEPARTMENT_OTHER)
Admission: EM | Admit: 2017-03-10 | Discharge: 2017-03-11 | Disposition: A | Discharge status: Home / Self Care | Payer: BLUE CROSS/BLUE SHIELD | Attending: Emergency Medicine | Admitting: Emergency Medicine

## 2017-03-10 DIAGNOSIS — R079 Chest pain, unspecified: Secondary | ICD-10-CM | POA: Diagnosis not present

## 2017-03-10 DIAGNOSIS — I10 Essential (primary) hypertension: Secondary | ICD-10-CM | POA: Diagnosis not present

## 2017-03-10 DIAGNOSIS — Z79899 Other long term (current) drug therapy: Secondary | ICD-10-CM | POA: Diagnosis not present

## 2017-03-10 DIAGNOSIS — R072 Precordial pain: Secondary | ICD-10-CM | POA: Diagnosis not present

## 2017-03-10 DIAGNOSIS — J45909 Unspecified asthma, uncomplicated: Secondary | ICD-10-CM | POA: Insufficient documentation

## 2017-03-10 DIAGNOSIS — G4489 Other headache syndrome: Secondary | ICD-10-CM

## 2017-03-10 LAB — CBC WITH DIFFERENTIAL/PLATELET
BASOS PCT: 0 %
Basophils Absolute: 0 10*3/uL (ref 0.0–0.1)
EOS ABS: 0.2 10*3/uL (ref 0.0–0.7)
EOS PCT: 3 %
HCT: 40.9 % (ref 36.0–46.0)
Hemoglobin: 13.5 g/dL (ref 12.0–15.0)
Lymphocytes Relative: 39 %
Lymphs Abs: 3 10*3/uL (ref 0.7–4.0)
MCH: 27.6 pg (ref 26.0–34.0)
MCHC: 33 g/dL (ref 30.0–36.0)
MCV: 83.5 fL (ref 78.0–100.0)
MONOS PCT: 5 %
Monocytes Absolute: 0.4 10*3/uL (ref 0.1–1.0)
NEUTROS PCT: 53 %
Neutro Abs: 4.1 10*3/uL (ref 1.7–7.7)
PLATELETS: 330 10*3/uL (ref 150–400)
RBC: 4.9 MIL/uL (ref 3.87–5.11)
RDW: 14.7 % (ref 11.5–15.5)
WBC: 7.7 10*3/uL (ref 4.0–10.5)

## 2017-03-10 LAB — TROPONIN I: Troponin I: <0.03 ng/mL (ref <0.03)

## 2017-03-10 LAB — COMPREHENSIVE METABOLIC PANEL
ALBUMIN: 4.2 g/dL (ref 3.5–5.0)
ALK PHOS: 89 U/L (ref 38–126)
ALT: 28 U/L (ref 14–54)
ANION GAP: 7 (ref 5–15)
AST: 29 U/L (ref 15–41)
BUN: 13 mg/dL (ref 6–20)
CHLORIDE: 99 mmol/L — AB (ref 101–111)
CO2: 29 mmol/L (ref 22–32)
Calcium: 9.5 mg/dL (ref 8.9–10.3)
Creatinine, Ser: 0.85 mg/dL (ref 0.44–1.00)
GFR calc non Af Amer: >60 mL/min (ref >60)
GLUCOSE: 121 mg/dL — AB (ref 65–99)
Potassium: 3.7 mmol/L (ref 3.5–5.1)
SODIUM: 135 mmol/L (ref 135–145)
TOTAL PROTEIN: 8.1 g/dL (ref 6.5–8.1)
Total Bilirubin: 0.7 mg/dL (ref 0.3–1.2)

## 2017-03-10 NOTE — ED Triage Notes (Signed)
Attempted to call patient for triaged room with no answer.

## 2017-03-10 NOTE — ED Triage Notes (Signed)
Pt reports chest pain that began Saturday. Pain is dull and in her left chest and radiates to her left arm and right side of her chest. Pt also reports HA since last Tuesday. Pt denies SOB, reports nausea and dizziness.

## 2017-03-10 NOTE — ED Notes (Signed)
Pt denies chest pain at this time. Pt reports her only complaint is her headache that has gotten worse since being here.

## 2017-03-11 MED ORDER — METOCLOPRAMIDE HCL 5 MG/ML IJ SOLN
10.0000 mg | Freq: Once | INTRAMUSCULAR | Status: AC
  Administered 2017-03-11: 10 mg via INTRAVENOUS
  Filled 2017-03-11: qty 2

## 2017-03-11 MED ORDER — DIPHENHYDRAMINE HCL 50 MG/ML IJ SOLN
25.0000 mg | Freq: Once | INTRAMUSCULAR | Status: AC
  Administered 2017-03-11: 25 mg via INTRAVENOUS
  Filled 2017-03-11: qty 1

## 2017-03-11 NOTE — ED Provider Notes (Signed)
[facility] Provider Note   CSN: [id number] Arrival date & time: 03/10/17  2043     History   Chief Complaint Chief Complaint  Patient presents with  . Chest Pain    HPI Kristina Washington is a 56 y.o. female.  The history is provided by the patient.  Chest Pain   This is a new problem. The current episode started more than 2 days ago. The problem occurs constantly. The problem has been resolved (resolved 2 days ago). The quality of the pain is described as pressure-like. Associated symptoms include headaches and shortness of breath. Pertinent negatives include no fever, no syncope, no vomiting and no weakness. She has tried nothing for the symptoms.  Headache   This is a new problem. The current episode started more than 2 days ago. The problem occurs constantly. The problem has been gradually improving. The headache is associated with nothing. Associated symptoms include shortness of breath. Pertinent negatives include no fever, no syncope and no vomiting. She has tried nothing for the symptoms.   Pt presents for multiple complaints She reports onset of CP several days ago, that has since resolved She also reports onset of HA several days ago that is persistent She has had random/unsustained "tingling" in her right forearm and right foot, none at this time No focal weakness No visual changes No h/o CAD/CVA She is also concerned about her HTN Taking meds as prescribed   Past Medical History:  Diagnosis Date  . Bronchial asthma   . Cataracts, bilateral   . Hypercholesterolemia   . Hypertension   . Menorrhagia     Patient Active Problem List   Diagnosis Date Noted  . Obesity, morbid ([place]) 03/10/2013  . Fibroid uterus 09/16/2012  . Bronchial asthma     Past Surgical History:  Procedure Laterality Date  . ENDOMETRIAL ABLATION  03/23/10  . FOOT SURGERY     BOTH RIGHT AND LEFT  . KNEE ARTHROSCOPY  2002   right knee  . TUBAL LIGATION      OB History      Gravida Para Term Preterm AB Living   2 2       2    SAB TAB Ectopic Multiple Live Births                   Home Medications    Prior to Admission medications   Medication Sig Start Date End Date Taking? Authorizing Provider  atorvastatin (LIPITOR) 40 MG tablet Take 40 mg by mouth daily.   Yes [provider]  albuterol (PROVENTIL HFA;VENTOLIN HFA) 108 (90 Base) MCG/ACT inhaler Inhale 2 puffs into the lungs every 4 (four) hours as needed for wheezing. 08/06/15   Montine Circle, PA-C  Cyanocobalamin (VITAMIN B 12 PO) Take by mouth.    [provider]  cyclobenzaprine (FLEXERIL) 10 MG tablet Take 1 tablet (10 mg total) by mouth 3 (three) times daily as needed for muscle spasms. 05/24/16   Street, Mercedes, PA-C  fluticasone furoate-vilanterol (BREO ELLIPTA) 200-25 MCG/INH AEPB Inhale 1 puff into the lungs daily. 08/06/15   Montine Circle, PA-C  meloxicam (MOBIC) 15 MG tablet Take 15 mg by mouth daily.    [provider]  montelukast (SINGULAIR) 10 MG tablet Take 1 tablet (10 mg total) by mouth daily. 08/06/15   Montine Circle, PA-C  Multiple Vitamin (MULTIVITAMIN) tablet Take 1 tablet by mouth daily.    [provider]  triamterene-hydrochlorothiazide (MAXZIDE-25) 37.5-25 MG tablet Take 1 tablet by  mouth daily.    [provider]    Family History Family History  Problem Relation Age of Onset  . Hypertension Father   . Cancer Father        COLON  . Diabetes Mother   . Hypertension Mother   . Cancer Mother   . Diabetes Sister   . Hypertension Sister   . Diabetes Paternal Aunt   . Diabetes Paternal Uncle     Social History Social History  Substance Use Topics  . Smoking status: Never Smoker  . Smokeless tobacco: Never Used  . Alcohol use No     Allergies   Patient has no known allergies.   Review of Systems Review of Systems  Constitutional: Negative for fever.  Eyes: Negative for visual disturbance.  Respiratory:  Positive for shortness of breath.   Cardiovascular: Positive for chest pain. Negative for syncope.  Gastrointestinal: Negative for vomiting.  Neurological: Positive for headaches. Negative for speech difficulty and weakness.  All other systems reviewed and are negative.    Physical Exam Updated Vital Signs BP (!) 149/89   Pulse 62   Temp 98.5 F (36.9 C)   Resp 18   Ht 1.778 m (5\' 10" )   Wt 117 kg (258 lb)   LMP 02/11/2014   SpO2 99%   BMI 37.02 kg/m   Physical Exam  CONSTITUTIONAL: Well developed/well nourished HEAD: Normocephalic/atraumatic EYES: EOMI/PERRL, no nystagmus, no ptosis ENMT: Mucous membranes moist NECK: supple no meningeal signs, no bruits SPINE/BACK:entire spine nontender CV: S1/S2 noted, no murmurs/rubs/gallops noted LUNGS: Lungs are clear to auscultation bilaterally, no apparent distress ABDOMEN: soft, nontender, no rebound or guarding GU:no cva tenderness NEURO:Awake/alert, face symmetric, no arm or leg drift is noted Equal 5/5 strength with shoulder abduction, elbow flex/extension, wrist flex/extension in upper extremities and equal hand grips bilaterally Equal 5/5 strength with hip flexion,knee flex/extension, foot dorsi/plantar flexion Cranial nerves 3/4/5/6/12/23/08/11/12 tested and intact Gait normal without ataxia No past pointing Sensation to light touch intact in all extremities EXTREMITIES: pulses normal, full ROM SKIN: warm, color normal PSYCH: no abnormalities of mood noted, alert and oriented to situation   ED Treatments / Results  Labs (all labs ordered are listed, but only abnormal results are displayed) Labs Reviewed  COMPREHENSIVE METABOLIC PANEL - Abnormal; Notable for the following:       Result Value   Chloride 99 (*)    Glucose, Bld 121 (*)    All other components within normal limits  TROPONIN I  CBC WITH DIFFERENTIAL/PLATELET    EKG  EKG Interpretation  Date/Time:  Monday March 10 2017 20:50:52 EDT Ventricular  Rate:  67 PR Interval:  196 QRS Duration: 94 QT Interval:  426 QTC Calculation: 450 R Axis:   -36 Text Interpretation:  Normal sinus rhythm Left axis deviation Incomplete right bundle branch block Abnormal ECG Confirmed by Ripley Fraise [phone]) on 03/11/2017 12:38:12 AM       Radiology Dg Chest 2 View  Result Date: 03/10/2017 CLINICAL DATA:  Chest pain radiating to the left arm. EXAM: CHEST  2 VIEW COMPARISON:  08/05/2015 FINDINGS: Heart and mediastinal contours are stable. No pneumonic consolidation, effusion or pneumothorax. Degenerative changes are stable along the dorsal spine. IMPRESSION: No active cardiopulmonary disease. Electronically Signed   By: Ashley Royalty M.D.   On: 03/10/2017 21:22    Procedures Procedures (including critical care time)  Medications Ordered in ED Medications  metoCLOPramide (REGLAN) injection 10 mg (10 mg Intravenous Given 03/11/17 0100)  diphenhydrAMINE (BENADRYL)  injection 25 mg (25 mg Intravenous Given 03/11/17 0058)     Initial Impression / Assessment and Plan / ED Course  I have reviewed the triage vital signs and the nursing notes.  Pertinent labs & imaging results that were available during my care of the patient were reviewed by me and considered in my medical decision making (see chart for details).     Pt well appearing She has multiple complaints For CP = this has resolved days ago, EKG unchanged and troponin negative Doubt ACS/PE/Dissection  As for HA, this is improving, no neuro deficits HTN improving She feels comfortable for d/c home We discussed return precautions   Final Clinical Impressions(s) / ED Diagnoses   Final diagnoses:  Precordial pain  Other headache syndrome    New Prescriptions Discharge Medication List as of 03/11/2017  1:14 AM       Ripley Fraise, MD 03/11/17 0321

## 2017-03-11 NOTE — ED Notes (Signed)
ED Provider at bedside. 

## 2017-03-11 NOTE — Discharge Instructions (Signed)

## 2017-03-14 DIAGNOSIS — G43001 Migraine without aura, not intractable, with status migrainosus: Secondary | ICD-10-CM | POA: Diagnosis not present

## 2017-03-14 DIAGNOSIS — I10 Essential (primary) hypertension: Secondary | ICD-10-CM | POA: Diagnosis not present

## 2017-04-23 DIAGNOSIS — I10 Essential (primary) hypertension: Secondary | ICD-10-CM | POA: Diagnosis not present

## 2017-04-23 DIAGNOSIS — G43909 Migraine, unspecified, not intractable, without status migrainosus: Secondary | ICD-10-CM | POA: Diagnosis not present

## 2017-07-22 DIAGNOSIS — Z23 Encounter for immunization: Secondary | ICD-10-CM | POA: Diagnosis not present

## 2017-07-22 DIAGNOSIS — B360 Pityriasis versicolor: Secondary | ICD-10-CM | POA: Diagnosis not present

## 2017-07-22 DIAGNOSIS — L219 Seborrheic dermatitis, unspecified: Secondary | ICD-10-CM | POA: Diagnosis not present

## 2017-08-05 DIAGNOSIS — E7800 Pure hypercholesterolemia, unspecified: Secondary | ICD-10-CM | POA: Diagnosis not present

## 2017-08-13 DIAGNOSIS — J45998 Other asthma: Secondary | ICD-10-CM | POA: Diagnosis not present

## 2017-08-13 DIAGNOSIS — R739 Hyperglycemia, unspecified: Secondary | ICD-10-CM | POA: Diagnosis not present

## 2017-08-13 DIAGNOSIS — E7800 Pure hypercholesterolemia, unspecified: Secondary | ICD-10-CM | POA: Diagnosis not present

## 2017-08-13 DIAGNOSIS — I10 Essential (primary) hypertension: Secondary | ICD-10-CM | POA: Diagnosis not present

## 2017-08-29 ENCOUNTER — Emergency Department (HOSPITAL_COMMUNITY)
Admission: EM | Admit: 2017-08-29 | Discharge: 2017-08-29 | Disposition: A | Discharge status: Home / Self Care | Payer: BLUE CROSS/BLUE SHIELD | Attending: Emergency Medicine | Admitting: Emergency Medicine

## 2017-08-29 ENCOUNTER — Emergency Department (HOSPITAL_COMMUNITY): Payer: BLUE CROSS/BLUE SHIELD

## 2017-08-29 ENCOUNTER — Encounter (HOSPITAL_COMMUNITY): Payer: Self-pay

## 2017-08-29 ENCOUNTER — Other Ambulatory Visit: Payer: Self-pay

## 2017-08-29 DIAGNOSIS — J029 Acute pharyngitis, unspecified: Secondary | ICD-10-CM | POA: Insufficient documentation

## 2017-08-29 DIAGNOSIS — R51 Headache: Secondary | ICD-10-CM | POA: Diagnosis not present

## 2017-08-29 DIAGNOSIS — J069 Acute upper respiratory infection, unspecified: Secondary | ICD-10-CM | POA: Diagnosis not present

## 2017-08-29 DIAGNOSIS — R0789 Other chest pain: Secondary | ICD-10-CM | POA: Insufficient documentation

## 2017-08-29 DIAGNOSIS — I10 Essential (primary) hypertension: Secondary | ICD-10-CM

## 2017-08-29 DIAGNOSIS — R0981 Nasal congestion: Secondary | ICD-10-CM | POA: Insufficient documentation

## 2017-08-29 DIAGNOSIS — R079 Chest pain, unspecified: Secondary | ICD-10-CM | POA: Diagnosis not present

## 2017-08-29 DIAGNOSIS — Z79899 Other long term (current) drug therapy: Secondary | ICD-10-CM | POA: Insufficient documentation

## 2017-08-29 DIAGNOSIS — R05 Cough: Secondary | ICD-10-CM | POA: Diagnosis not present

## 2017-08-29 LAB — I-STAT CHEM 8, ED
BUN: 13 mg/dL (ref 6–20)
CALCIUM ION: 1.18 mmol/L (ref 1.15–1.40)
CHLORIDE: 102 mmol/L (ref 101–111)
Creatinine, Ser: 0.7 mg/dL (ref 0.44–1.00)
GLUCOSE: 86 mg/dL (ref 65–99)
HCT: 39 % (ref 36.0–46.0)
Hemoglobin: 13.3 g/dL (ref 12.0–15.0)
POTASSIUM: 3.7 mmol/L (ref 3.5–5.1)
SODIUM: 140 mmol/L (ref 135–145)
TCO2: 29 mmol/L (ref 22–32)

## 2017-08-29 LAB — I-STAT TROPONIN, ED: Troponin i, poc: 0 ng/mL (ref 0.00–0.08)

## 2017-08-29 MED ORDER — GUAIFENESIN ER 600 MG PO TB12: 600.0000 mg | ORAL_TABLET | Freq: Two times a day (BID) | ORAL | 0 refills | Status: DC

## 2017-08-29 MED ORDER — BENZONATATE 100 MG PO CAPS: 100.0000 mg | ORAL_CAPSULE | Freq: Three times a day (TID) | ORAL | 0 refills | Status: DC

## 2017-08-29 NOTE — ED Triage Notes (Signed)
Patient presents with flu like symptoms starting 4 days ago. Patient reports she started having nasal congestion and a productive cough with yellow sputum on Tuesday, then her symptoms progressed to generalized body aches, chills, sneezing, sore throat, and chest tightness before and after coughing. Patient has history of asthma and bronchitis. Patient denies SOB at this time, but endorses chest "tightness not pain."

## 2017-08-29 NOTE — ED Provider Notes (Signed)
[facility] DEPT Provider Note   CSN: [id number] Arrival date & time: 08/29/17  1101     History   Chief Complaint Chief Complaint  Patient presents with  . Cough  . Nasal Congestion    HPI Kristina Washington is a 57 y.o. female.  57 year old female with a history of asthma presents to ED complaining of productive cough with yellow sputum, nasal congestion, body aches, headaches, sore throat, chest tightness that began about 4 days ago.  Patient denies any fevers.  States that she started having chest tightness several days ago that intermittently worsens. Not associated with exertion.  Located predominantly to the left side of her chest.  Does not radiate.  Describes it as "feels like gas pains", and "tightness".  Rates pain 3/10.  States she also has some mild pain in the chest right now, however it is on the right side. States she has been taking her inhalers at home.  She does not feel short of breath right now.  With regard to headache, denies worse headache of life, states feels like sinus headache.  States pain is mild.  No vision changes, photophobia, numbness/tingling/weakness to bilateral upper or lower extremities.  No abdominal pain, nausea, vomiting, diarrhea.  States she did not take her blood pressure medicine this morning.  Denies leg pain/swelling, hemoptysis, recent surgery/trauma, recent long travel, hormone use, personal hx of cancer, or hx of DVT/PE.   Past Medical History:  Diagnosis Date  . Bronchial asthma   . Cataracts, bilateral   . Hypercholesterolemia   . Hypertension   . Menorrhagia     Patient Active Problem List   Diagnosis Date Noted  . Obesity, morbid ([place]) 03/10/2013  . Fibroid uterus 09/16/2012  . Bronchial asthma     Past Surgical History:  Procedure Laterality Date  . ENDOMETRIAL ABLATION  03/23/10  . FOOT SURGERY     BOTH RIGHT AND LEFT  . KNEE ARTHROSCOPY  2002   right knee  . TUBAL LIGATION      OB  History    Gravida Para Term Preterm AB Living   2 2       2    SAB TAB Ectopic Multiple Live Births                   Home Medications    Prior to Admission medications   Medication Sig Start Date End Date Taking? Authorizing Provider  albuterol (PROVENTIL HFA;VENTOLIN HFA) 108 (90 Base) MCG/ACT inhaler Inhale 2 puffs into the lungs every 4 (four) hours as needed for wheezing. 08/06/15   Montine Circle, PA-C  atorvastatin (LIPITOR) 40 MG tablet Take 40 mg by mouth daily.    [provider]  benzonatate (TESSALON) 100 MG capsule Take 1 capsule (100 mg total) by mouth every 8 (eight) hours. 08/29/17   Jhayden Demuro S, PA-C  Cyanocobalamin (VITAMIN B 12 PO) Take by mouth.    [provider]  cyclobenzaprine (FLEXERIL) 10 MG tablet Take 1 tablet (10 mg total) by mouth 3 (three) times daily as needed for muscle spasms. 05/24/16   Street, Mercedes, PA-C  fluticasone furoate-vilanterol (BREO ELLIPTA) 200-25 MCG/INH AEPB Inhale 1 puff into the lungs daily. 08/06/15   Montine Circle, PA-C  guaiFENesin (MUCINEX) 600 MG 12 hr tablet Take 1 tablet (600 mg total) by mouth 2 (two) times daily. 08/29/17   Sarthak Rubenstein S, PA-C  meloxicam (MOBIC) 15 MG tablet Take 15 mg by mouth daily.    [provider]  montelukast (SINGULAIR) 10 MG tablet Take 1 tablet (10 mg total) by mouth daily. 08/06/15   Montine Circle, PA-C  Multiple Vitamin (MULTIVITAMIN) tablet Take 1 tablet by mouth daily.    [provider]  triamterene-hydrochlorothiazide (MAXZIDE-25) 37.5-25 MG tablet Take 1 tablet by mouth daily.    [provider]    Family History Family History  Problem Relation Age of Onset  . Hypertension Father   . Cancer Father        COLON  . Diabetes Mother   . Hypertension Mother   . Cancer Mother   . Diabetes Sister   . Hypertension Sister   . Diabetes Paternal Aunt   . Diabetes Paternal Uncle     Social History Social History   Tobacco Use  .  Smoking status: Never Smoker  . Smokeless tobacco: Never Used  Substance Use Topics  . Alcohol use: No  . Drug use: No     Allergies   Patient has no known allergies.   Review of Systems Review of Systems  Constitutional: Positive for chills.  HENT: Positive for congestion, sinus pressure, sinus pain and sore throat. Negative for ear pain and trouble swallowing.   Eyes: Negative for visual disturbance.  Respiratory: Positive for cough and wheezing. Negative for shortness of breath.   Cardiovascular: Positive for chest pain and leg swelling (chronic, baseline).  Gastrointestinal: Negative for abdominal pain, constipation, diarrhea, nausea and vomiting.  Genitourinary: Negative for flank pain.  Musculoskeletal: Negative for back pain, neck pain and neck stiffness.  Skin: Negative for rash.  Neurological: Positive for headaches. Negative for dizziness, weakness, light-headedness and numbness.     Physical Exam Updated Vital Signs BP (!) 157/89   Pulse 81   Temp 98.6 F (37 C) (Oral)   Resp 18   Ht 5\' 10"  (1.778 m)   Wt 117.9 kg (260 lb)   LMP 01/28/2014   SpO2 99%   BMI 37.31 kg/m   Physical Exam  Constitutional: She is oriented to person, place, and time. She appears well-developed and well-nourished. No distress.  HENT:  Head: Normocephalic and atraumatic.  Mild pharyngeal erythema.  No tonsillar swelling or exudates.  Uvula midline.  No evidence of PTA.  Normal voice.  Mucous membranes are moist.  Bilateral TMs are without erythema or effusion.  Eyes: Conjunctivae and EOM are normal. Pupils are equal, round, and reactive to light.  Neck: Neck supple.  No nuchal rigidity  Cardiovascular: Normal rate, regular rhythm, normal heart sounds and intact distal pulses.  No murmur heard. Pulmonary/Chest: Effort normal and breath sounds normal. No stridor. No respiratory distress. She has no wheezes.  Abdominal: Soft. There is no tenderness.  Musculoskeletal: She exhibits no  edema.  No calf TTP, erythema, swelling.  Neurological: She is alert and oriented to person, place, and time. No cranial nerve deficit.  5/5 to bilateral upper and lower externally's.  Normal sensation throughout.  Skin: Skin is warm and dry. Capillary refill takes less than 2 seconds.  Psychiatric: She has a normal mood and affect.  Nursing note and vitals reviewed.    ED Treatments / Results  Labs (all labs ordered are listed, but only abnormal results are displayed) Labs Reviewed  I-STAT TROPONIN, ED  I-STAT CHEM 8, ED    EKG  EKG Interpretation  Date/Time:  Friday August 29 2017 12:06:40 EDT Ventricular Rate:  86 PR Interval:    QRS Duration: 92 QT Interval:  385 QTC Calculation: 461 R  Axis:   -24 Text Interpretation:  Sinus rhythm Borderline left axis deviation Confirmed by Virgel Manifold [phone]) on 08/29/2017 2:43:11 PM       Radiology Dg Chest 2 View  Result Date: 08/29/2017 CLINICAL DATA:  Cough and chest pain. EXAM: CHEST - 2 VIEW COMPARISON:  Two-view chest x-ray a 03/10/2017. FINDINGS: Heart size is normal. There is no edema or effusion. No focal airspace disease is present. The visualized soft tissues and bony thorax are unremarkable. Degenerative changes are again noted at the [facility] joints bilaterally. IMPRESSION: Negative two view chest x-ray Electronically Signed   By: San Morelle M.D.   On: 08/29/2017 12:32    Procedures Procedures (including critical care time)  Medications Ordered in ED Medications - No data to display   Initial Impression / Assessment and Plan / ED Course  I have reviewed the triage vital signs and the nursing notes.  Pertinent labs & imaging results that were available during my care of the patient were reviewed by me and considered in my medical decision making (see chart for details).     ECG reviewed by Dr. Wilson Singer.  Final Clinical Impressions(s) / ED Diagnoses   Final diagnoses:  Upper respiratory tract infection,  unspecified type  Hypertension, unspecified type   Patients symptoms are consistent with URI, likely viral etiology. ECG reviewed and showed NSR HR 86. No st elevations or depressions suggestive of ischemia. No arrhythmia. CXR reviewed and negative for PNA, PTX, or widened mediastinum. Chem 8 with normal electrolytes and hgb. Trop neg. Doubt acs. Doubt PE. Chest tightness likely due to recent cough and chest congestion from asthma in setting of URI.   Discussed that antibiotics are not indicated for viral infections. Pt will be discharged with symptomatic treatment.  Verbalizes understanding and is agreeable with plan. Pt is hemodynamically stable & in NAD prior to dc.   ED Discharge Orders        Ordered    guaiFENesin (MUCINEX) 600 MG 12 hr tablet  2 times daily     08/29/17 1321    benzonatate (TESSALON) 100 MG capsule  Every 8 hours     08/29/17 1505       Jacky Dross S, PA-C 08/29/17 1505    Virgel Manifold, MD 08/30/17 1204

## 2017-08-29 NOTE — Discharge Instructions (Addendum)
If you were given a prescription, please take the prescription as you were instructed and follow the directions given on the discharge paperwork.   Over the next several days you should rest as much as possible, and drink more fluids than usual. Liquids will help thin and loosen mucus so you can cough it up. Liquids will also help prevent dehydration. Using a cool mist humidifier or a vaporizer to increase air moisture in your home can also make it easier for you to breathe and help decrease your cough.  To help soothe a sore throat gargle with warm salt water.  Make salt water by dissolving  teaspoon salt in 1 cup warm water. You may also use throat lozenges and over the counter sore throat spray.  Please follow up with your primary care provider within 5-7 days for re-evaluation of your symptoms. If you do not have a primary care provider, information for a healthcare clinic has been provided for you to make arrangements for follow up care. Please return to the emergency department for any persistent fevers, worsening sore throat/hoarse voice, inability to swallow, persistent vomiting, chest pain, shortness of breath, coughing up blood, or any new or worsening symptoms.  You were noted to have high blood pressure today during your visit in the emergency department. You will need to follow up with your primary healthcare provider to have your blood pressure rechecked as you may need to be started on medication for this if it remains elevated. If you experience any chest pain, shortness of breath, headaches, vision changes, numbness, weakness, lightheadedness, changes in mental status, or decrease in urination you should return to the emergency department immediately.

## 2017-09-10 DIAGNOSIS — R05 Cough: Secondary | ICD-10-CM | POA: Diagnosis not present

## 2017-09-12 DIAGNOSIS — R7303 Prediabetes: Secondary | ICD-10-CM | POA: Diagnosis not present

## 2017-09-12 DIAGNOSIS — E782 Mixed hyperlipidemia: Secondary | ICD-10-CM | POA: Diagnosis not present

## 2017-09-12 DIAGNOSIS — Z1231 Encounter for screening mammogram for malignant neoplasm of breast: Secondary | ICD-10-CM | POA: Diagnosis not present

## 2017-09-22 DIAGNOSIS — R05 Cough: Secondary | ICD-10-CM | POA: Diagnosis not present

## 2017-09-26 IMAGING — DX DG CHEST 2V
2 series · 2 of 2 positions shown · non-contrast
Comparison: 08/05/2015

CLINICAL DATA: Chest pain radiating to the left arm.

EXAM:
CHEST  2 VIEW

[chest pa]
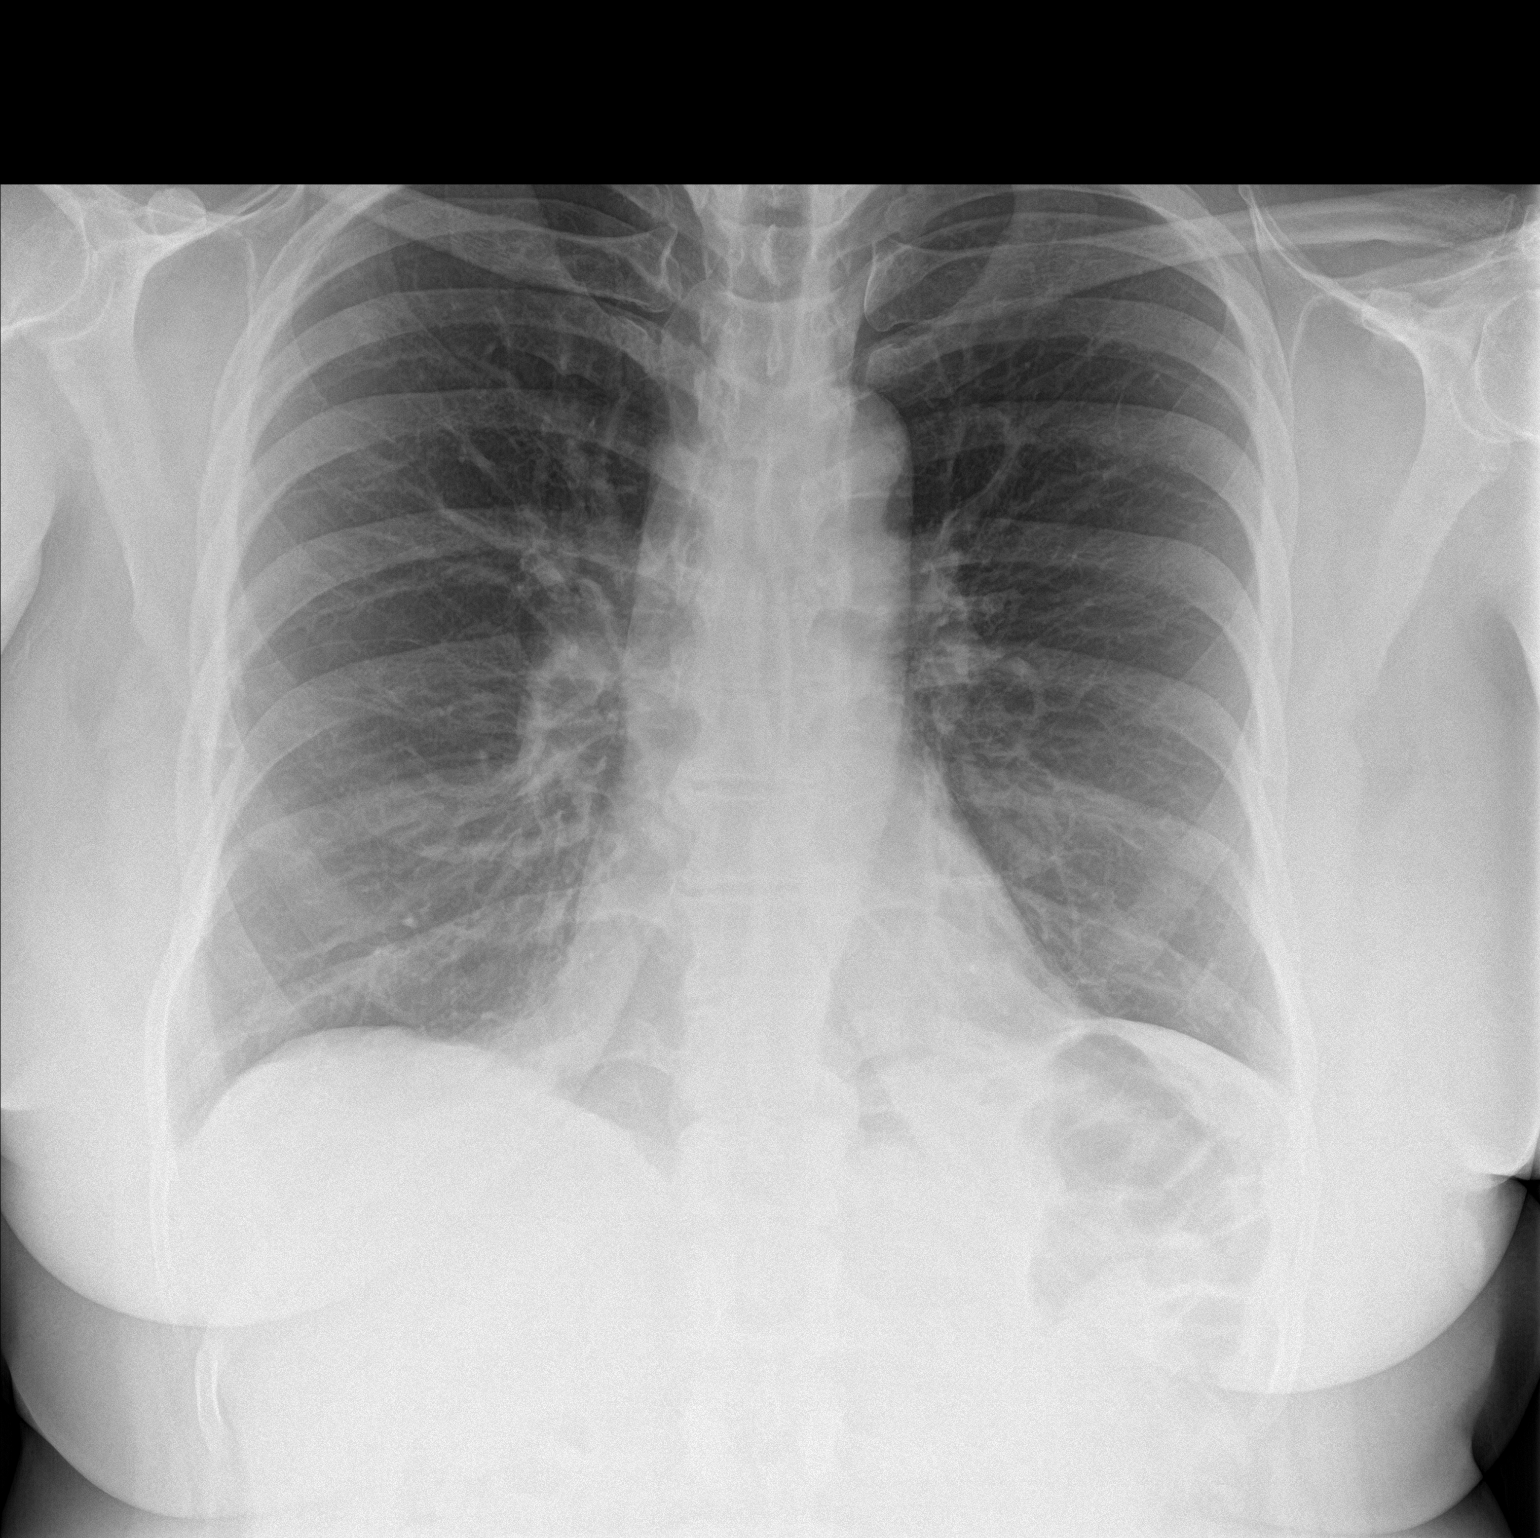

[chest lat]
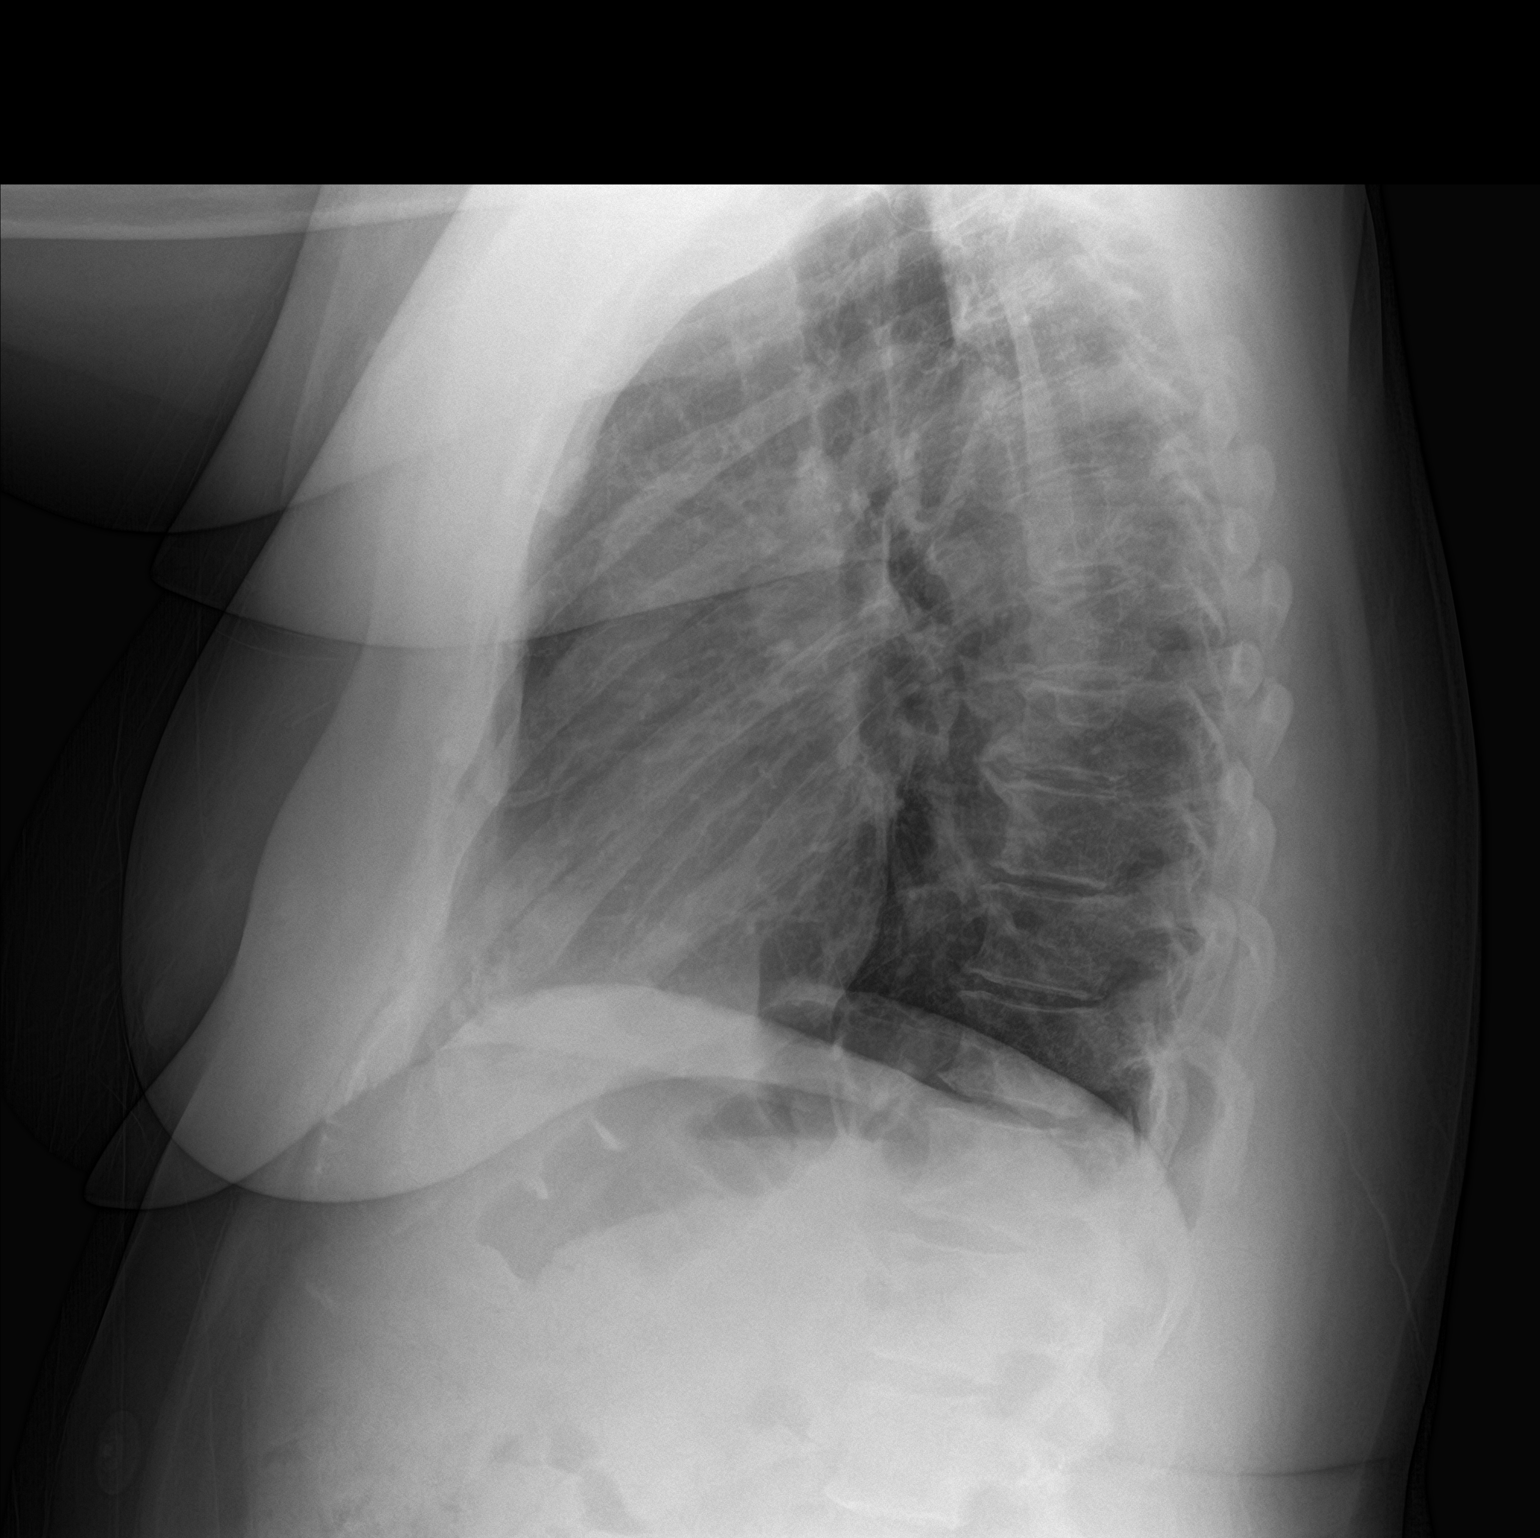

[2 of 2 positions shown; findings below may reference images not displayed]

FINDINGS: Heart and mediastinal contours are stable. No pneumonic
consolidation, effusion or pneumothorax. Degenerative changes are
stable along the dorsal spine.
IMPRESSION: No active cardiopulmonary disease.

## 2018-01-07 ENCOUNTER — Ambulatory Visit: Payer: BLUE CROSS/BLUE SHIELD | Admitting: Women's Health

## 2018-01-07 ENCOUNTER — Encounter: Payer: Self-pay | Admitting: Women's Health

## 2018-01-07 VITALS — BP 128/80 | Ht 70.0 in | Wt 268.0 lb

## 2018-01-07 DIAGNOSIS — Z01419 Encounter for gynecological examination (general) (routine) without abnormal findings: Secondary | ICD-10-CM

## 2018-01-07 NOTE — Progress Notes (Signed)
Kristina Washington Nov 18, 1960 [id number]    History:    Presents for annual exam.  Continues to cycle every 4 to 6 months last cycle 4 months ago.  Having increased hot flushes this past year.  Normal Pap and mammogram history.  2017 2 benign colon polyps 5-year follow-up.  History of her option and BTL.   Primary care manages hypertension, asthma and hypercholesteremia.  Not sexually active, denies need for STD screen.  Past medical history, past surgical history, family history and social history were all reviewed and documented in the EPIC chart.  Glass blower/designer for an eye doctor.  Daughter has lupus doing better, son is healthy 23 working.  ROS:  A ROS was performed and pertinent positives and negatives are included.  Exam:  Vitals:   01/07/18 1223  BP: 128/80  Weight: 268 lb (121.6 kg)  Height: 5\' 10"  (1.778 m)   Body mass index is 38.45 kg/m.   General appearance:  Normal Thyroid:  Symmetrical, normal in size, without palpable masses or nodularity. Respiratory  Auscultation:  Clear without wheezing or rhonchi Cardiovascular  Auscultation:  Regular rate, without rubs, murmurs or gallops  Edema/varicosities:  Not grossly evident Abdominal  Soft,nontender, without masses, guarding or rebound.  Liver/spleen:  No organomegaly noted  Hernia:  None appreciated  Skin  Inspection:  Grossly normal   Breasts: Examined lying and sitting.     Right: Without masses, retractions, discharge or axillary adenopathy.     Left: Without masses, retractions, discharge or axillary adenopathy. Gentitourinary   Inguinal/mons:  Normal without inguinal adenopathy  External genitalia:  Normal  BUS/Urethra/Skene's glands:  Normal  Vagina:  Normal  Cervix:  Normal  Uterus:   normal in size, shape and contour.  Midline and mobile  Adnexa/parametria:     Rt: Without masses or tenderness.   Lt: Without masses or tenderness.  Anus and perineum: Normal  Digital rectal exam: Normal sphincter tone  without palpated masses or tenderness  Assessment/Plan:  57 y.o. SBF G2P2 for annual exam with no complaints.  Perimenopausal/BTL Hypertension, hypercholesteremia, asthma-primary care manages labs and meds Obesity  Plan: Menopause reviewed. SBE's, annual screening mammogram, calcium rich foods, vitamin D 2000 daily encouraged.  Reviewed importance of increasing exercise, decreasing calorie/carbs for weight loss.  Pap normal 2018, new screening guidelines reviewed.    Kristina Washington, 12:51 PM 01/07/2018

## 2018-01-07 NOTE — Patient Instructions (Signed)
Health Maintenance for Postmenopausal Women Menopause is a normal process in which your reproductive ability comes to an end. This process happens gradually over a span of months to years, usually between the ages of 22 and 9. Menopause is complete when you have missed 12 consecutive menstrual periods. It is important to talk with your health care provider about some of the most common conditions that affect postmenopausal women, such as heart disease, cancer, and bone loss (osteoporosis). Adopting a healthy lifestyle and getting preventive care can help to promote your health and wellness. Those actions can also lower your chances of developing some of these common conditions. What should I know about menopause? During menopause, you may experience a number of symptoms, such as:  Moderate-to-severe hot flashes.  Night sweats.  Decrease in sex drive.  Mood swings.  Headaches.  Tiredness.  Irritability.  Memory problems.  Insomnia.  Choosing to treat or not to treat menopausal changes is an individual decision that you make with your health care provider. What should I know about hormone replacement therapy and supplements? Hormone therapy products are effective for treating symptoms that are associated with menopause, such as hot flashes and night sweats. Hormone replacement carries certain risks, especially as you become older. If you are thinking about using estrogen or estrogen with progestin treatments, discuss the benefits and risks with your health care provider. What should I know about heart disease and stroke? Heart disease, heart attack, and stroke become more likely as you age. This may be due, in part, to the hormonal changes that your body experiences during menopause. These can affect how your body processes dietary fats, triglycerides, and cholesterol. Heart attack and stroke are both medical emergencies. There are many things that you can do to help prevent heart disease  and stroke:  Have your blood pressure checked at least every 1-2 years. High blood pressure causes heart disease and increases the risk of stroke.  If you are 53-22 years old, ask your health care provider if you should take aspirin to prevent a heart attack or a stroke.  Do not use any tobacco products, including cigarettes, chewing tobacco, or electronic cigarettes. If you need help quitting, ask your health care provider.  It is important to eat a healthy diet and maintain a healthy weight. ? Be sure to include plenty of vegetables, fruits, low-fat dairy products, and lean protein. ? Avoid eating foods that are high in solid fats, added sugars, or salt (sodium).  Get regular exercise. This is one of the most important things that you can do for your health. ? Try to exercise for at least 150 minutes each week. The type of exercise that you do should increase your heart rate and make you sweat. This is known as moderate-intensity exercise. ? Try to do strengthening exercises at least twice each week. Do these in addition to the moderate-intensity exercise.  Know your numbers.Ask your health care provider to check your cholesterol and your blood glucose. Continue to have your blood tested as directed by your health care provider.  What should I know about cancer screening? There are several types of cancer. Take the following steps to reduce your risk and to catch any cancer development as early as possible. Breast Cancer  Practice breast self-awareness. ? This means understanding how your breasts normally appear and feel. ? It also means doing regular breast self-exams. Let your health care provider know about any changes, no matter how small.  If you are 40  or older, have a clinician do a breast exam (clinical breast exam or CBE) every year. Depending on your age, family history, and medical history, it may be recommended that you also have a yearly breast X-ray (mammogram).  If you  have a family history of breast cancer, talk with your health care provider about genetic screening.  If you are at high risk for breast cancer, talk with your health care provider about having an MRI and a mammogram every year.  Breast cancer (BRCA) gene test is recommended for women who have family members with BRCA-related cancers. Results of the assessment will determine the need for genetic counseling and BRCA1 and for BRCA2 testing. BRCA-related cancers include these types: ? Breast. This occurs in males or females. ? Ovarian. ? Tubal. This may also be called fallopian tube cancer. ? Cancer of the abdominal or pelvic lining (peritoneal cancer). ? Prostate. ? Pancreatic.  Cervical, Uterine, and Ovarian Cancer Your health care provider may recommend that you be screened regularly for cancer of the pelvic organs. These include your ovaries, uterus, and vagina. This screening involves a pelvic exam, which includes checking for microscopic changes to the surface of your cervix (Pap test).  For women ages 21-65, health care providers may recommend a pelvic exam and a Pap test every three years. For women ages 79-65, they may recommend the Pap test and pelvic exam, combined with testing for human papilloma virus (HPV), every five years. Some types of HPV increase your risk of cervical cancer. Testing for HPV may also be done on women of any age who have unclear Pap test results.  Other health care providers may not recommend any screening for nonpregnant women who are considered low risk for pelvic cancer and have no symptoms. Ask your health care provider if a screening pelvic exam is right for you.  If you have had past treatment for cervical cancer or a condition that could lead to cancer, you need Pap tests and screening for cancer for at least 20 years after your treatment. If Pap tests have been discontinued for you, your risk factors (such as having a new sexual partner) need to be  reassessed to determine if you should start having screenings again. Some women have medical problems that increase the chance of getting cervical cancer. In these cases, your health care provider may recommend that you have screening and Pap tests more often.  If you have a family history of uterine cancer or ovarian cancer, talk with your health care provider about genetic screening.  If you have vaginal bleeding after reaching menopause, tell your health care provider.  There are currently no reliable tests available to screen for ovarian cancer.  Lung Cancer Lung cancer screening is recommended for adults 69-62 years old who are at high risk for lung cancer because of a history of smoking. A yearly low-dose CT scan of the lungs is recommended if you:  Currently smoke.  Have a history of at least 30 pack-years of smoking and you currently smoke or have quit within the past 15 years. A pack-year is smoking an average of one pack of cigarettes per day for one year.  Yearly screening should:  Continue until it has been 15 years since you quit.  Stop if you develop a health problem that would prevent you from having lung cancer treatment.  Colorectal Cancer  This type of cancer can be detected and can often be prevented.  Routine colorectal cancer screening usually begins at  age 42 and continues through age 45.  If you have risk factors for colon cancer, your health care provider may recommend that you be screened at an earlier age.  If you have a family history of colorectal cancer, talk with your health care provider about genetic screening.  Your health care provider may also recommend using home test kits to check for hidden blood in your stool.  A small camera at the end of a tube can be used to examine your colon directly (sigmoidoscopy or colonoscopy). This is done to check for the earliest forms of colorectal cancer.  Direct examination of the colon should be repeated every  5-10 years until age 71. However, if early forms of precancerous polyps or small growths are found or if you have a family history or genetic risk for colorectal cancer, you may need to be screened more often.  Skin Cancer  Check your skin from head to toe regularly.  Monitor any moles. Be sure to tell your health care provider: ? About any new moles or changes in moles, especially if there is a change in a mole's shape or color. ? If you have a mole that is larger than the size of a pencil eraser.  If any of your family members has a history of skin cancer, especially at a Artemio Dobie age, talk with your health care provider about genetic screening.  Always use sunscreen. Apply sunscreen liberally and repeatedly throughout the day.  Whenever you are outside, protect yourself by wearing long sleeves, pants, a wide-brimmed hat, and sunglasses.  What should I know about osteoporosis? Osteoporosis is a condition in which bone destruction happens more quickly than new bone creation. After menopause, you may be at an increased risk for osteoporosis. To help prevent osteoporosis or the bone fractures that can happen because of osteoporosis, the following is recommended:  If you are 46-71 years old, get at least 1,000 mg of calcium and at least 600 mg of vitamin D per day.  If you are older than age 55 but younger than age 65, get at least 1,200 mg of calcium and at least 600 mg of vitamin D per day.  If you are older than age 54, get at least 1,200 mg of calcium and at least 800 mg of vitamin D per day.  Smoking and excessive alcohol intake increase the risk of osteoporosis. Eat foods that are rich in calcium and vitamin D, and do weight-bearing exercises several times each week as directed by your health care provider. What should I know about how menopause affects my mental health? Depression may occur at any age, but it is more common as you become older. Common symptoms of depression  include:  Low or sad mood.  Changes in sleep patterns.  Changes in appetite or eating patterns.  Feeling an overall lack of motivation or enjoyment of activities that you previously enjoyed.  Frequent crying spells.  Talk with your health care provider if you think that you are experiencing depression. What should I know about immunizations? It is important that you get and maintain your immunizations. These include:  Tetanus, diphtheria, and pertussis (Tdap) booster vaccine.  Influenza every year before the flu season begins.  Pneumonia vaccine.  Shingles vaccine.  Your health care provider may also recommend other immunizations. This information is not intended to replace advice given to you by your health care provider. Make sure you discuss any questions you have with your health care provider. Document Released: 07/26/2005  Document Revised: 12/22/2015 Document Reviewed: 03/07/2015 [employer]vier Interactive Patient Education  2018 [employer]  

## 2018-02-06 DIAGNOSIS — R739 Hyperglycemia, unspecified: Secondary | ICD-10-CM | POA: Diagnosis not present

## 2018-02-06 DIAGNOSIS — E559 Vitamin D deficiency, unspecified: Secondary | ICD-10-CM | POA: Diagnosis not present

## 2018-02-06 DIAGNOSIS — E7800 Pure hypercholesterolemia, unspecified: Secondary | ICD-10-CM | POA: Diagnosis not present

## 2018-02-06 DIAGNOSIS — I10 Essential (primary) hypertension: Secondary | ICD-10-CM | POA: Diagnosis not present

## 2018-02-13 DIAGNOSIS — R002 Palpitations: Secondary | ICD-10-CM | POA: Diagnosis not present

## 2018-02-13 DIAGNOSIS — R05 Cough: Secondary | ICD-10-CM | POA: Diagnosis not present

## 2018-02-13 DIAGNOSIS — Z23 Encounter for immunization: Secondary | ICD-10-CM | POA: Diagnosis not present

## 2018-02-13 DIAGNOSIS — Z0000 Encounter for general adult medical examination without abnormal findings: Secondary | ICD-10-CM | POA: Diagnosis not present

## 2018-02-13 DIAGNOSIS — R739 Hyperglycemia, unspecified: Secondary | ICD-10-CM | POA: Diagnosis not present

## 2018-02-13 DIAGNOSIS — I10 Essential (primary) hypertension: Secondary | ICD-10-CM | POA: Diagnosis not present

## 2018-03-06 DIAGNOSIS — E669 Obesity, unspecified: Secondary | ICD-10-CM | POA: Diagnosis not present

## 2018-03-06 DIAGNOSIS — R7303 Prediabetes: Secondary | ICD-10-CM | POA: Diagnosis not present

## 2018-03-20 DIAGNOSIS — E669 Obesity, unspecified: Secondary | ICD-10-CM | POA: Diagnosis not present

## 2018-03-20 DIAGNOSIS — R635 Abnormal weight gain: Secondary | ICD-10-CM | POA: Diagnosis not present

## 2018-03-20 DIAGNOSIS — E782 Mixed hyperlipidemia: Secondary | ICD-10-CM | POA: Diagnosis not present

## 2018-04-18 ENCOUNTER — Emergency Department (HOSPITAL_BASED_OUTPATIENT_CLINIC_OR_DEPARTMENT_OTHER)
Admission: EM | Admit: 2018-04-18 | Discharge: 2018-04-18 | Disposition: A | Discharge status: Home / Self Care | Payer: BLUE CROSS/BLUE SHIELD | Attending: Emergency Medicine | Admitting: Emergency Medicine

## 2018-04-18 ENCOUNTER — Emergency Department (HOSPITAL_BASED_OUTPATIENT_CLINIC_OR_DEPARTMENT_OTHER): Payer: BLUE CROSS/BLUE SHIELD

## 2018-04-18 ENCOUNTER — Other Ambulatory Visit: Payer: Self-pay

## 2018-04-18 ENCOUNTER — Encounter (HOSPITAL_BASED_OUTPATIENT_CLINIC_OR_DEPARTMENT_OTHER): Payer: Self-pay | Admitting: Emergency Medicine

## 2018-04-18 DIAGNOSIS — J069 Acute upper respiratory infection, unspecified: Secondary | ICD-10-CM | POA: Diagnosis not present

## 2018-04-18 DIAGNOSIS — R05 Cough: Secondary | ICD-10-CM | POA: Insufficient documentation

## 2018-04-18 DIAGNOSIS — Z79899 Other long term (current) drug therapy: Secondary | ICD-10-CM | POA: Insufficient documentation

## 2018-04-18 DIAGNOSIS — B9789 Other viral agents as the cause of diseases classified elsewhere: Secondary | ICD-10-CM

## 2018-04-18 DIAGNOSIS — I10 Essential (primary) hypertension: Secondary | ICD-10-CM | POA: Insufficient documentation

## 2018-04-18 DIAGNOSIS — R69 Illness, unspecified: Secondary | ICD-10-CM

## 2018-04-18 DIAGNOSIS — J111 Influenza due to unidentified influenza virus with other respiratory manifestations: Secondary | ICD-10-CM | POA: Diagnosis not present

## 2018-04-18 DIAGNOSIS — R0602 Shortness of breath: Secondary | ICD-10-CM | POA: Diagnosis not present

## 2018-04-18 DIAGNOSIS — R079 Chest pain, unspecified: Secondary | ICD-10-CM | POA: Diagnosis not present

## 2018-04-18 DIAGNOSIS — M7918 Myalgia, other site: Secondary | ICD-10-CM | POA: Diagnosis not present

## 2018-04-18 NOTE — ED Provider Notes (Signed)
[facility] EMERGENCY DEPARTMENT Provider Note   CSN: [id number] Arrival date & time: 04/18/18  1110     History   Chief Complaint Chief Complaint  Patient presents with  . Flu like symtpoms    HPI Kristina Washington is a 57 y.o. female.  Patient c/o fever, body aches, occ non productive cough, in the past 1-2 days. Notes fever to 104. Denies sore throat. No purulent sinus drainage or pain. Notes body aches. No neck pain or stiffness. No chest pain or sob. No abd pain. No vomiting or diarrhea. No dysuria, urgency or gu c/o. No rash. Denies back/spine, or extremity pain. No definite known ill contacts.   The history is provided by the patient.    Past Medical History:  Diagnosis Date  . Bronchial asthma   . Cataracts, bilateral   . Hypercholesterolemia   . Hypertension   . Menorrhagia     Patient Active Problem List   Diagnosis Date Noted  . Obesity, morbid ([place]) 03/10/2013  . Fibroid uterus 09/16/2012  . Bronchial asthma     Past Surgical History:  Procedure Laterality Date  . ENDOMETRIAL ABLATION  03/23/10  . FOOT SURGERY     BOTH RIGHT AND LEFT  . KNEE ARTHROSCOPY  2002   right knee  . TUBAL LIGATION       OB History    Gravida  2   Para  2   Term      Preterm      AB      Living  2     SAB      TAB      Ectopic      Multiple      Live Births               Home Medications    Prior to Admission medications   Medication Sig Start Date End Date Taking? Authorizing Provider  albuterol (PROVENTIL HFA;VENTOLIN HFA) 108 (90 Base) MCG/ACT inhaler Inhale 2 puffs into the lungs every 4 (four) hours as needed for wheezing. 08/06/15   Montine Circle, PA-C  atorvastatin (LIPITOR) 40 MG tablet Take 40 mg by mouth daily.    [provider]  Cyanocobalamin (VITAMIN B 12 PO) Take by mouth.    [provider]  cyclobenzaprine (FLEXERIL) 10 MG tablet Take 1 tablet (10 mg total) by mouth 3 (three) times daily as needed  for muscle spasms. 05/24/16   Street, Mercedes, PA-C  fluticasone furoate-vilanterol (BREO ELLIPTA) 200-25 MCG/INH AEPB Inhale 1 puff into the lungs daily. 08/06/15   Montine Circle, PA-C  guaiFENesin (MUCINEX) 600 MG 12 hr tablet Take 1 tablet (600 mg total) by mouth 2 (two) times daily. 08/29/17   Couture, Cortni S, PA-C  meloxicam (MOBIC) 15 MG tablet Take 15 mg by mouth daily.    [provider]  montelukast (SINGULAIR) 10 MG tablet Take 1 tablet (10 mg total) by mouth daily. 08/06/15   Montine Circle, PA-C  Multiple Vitamin (MULTIVITAMIN) tablet Take 1 tablet by mouth daily.    [provider]  triamterene-hydrochlorothiazide (MAXZIDE-25) 37.5-25 MG tablet Take 1 tablet by mouth daily.    [provider]    Family History Family History  Problem Relation Age of Onset  . Hypertension Father   . Cancer Father        COLON  . Diabetes Mother   . Hypertension Mother   . Cancer Mother   . Diabetes Sister   . Hypertension Sister   .  Diabetes Paternal Aunt   . Diabetes Paternal Uncle     Social History Social History   Tobacco Use  . Smoking status: Never Smoker  . Smokeless tobacco: Never Used  Substance Use Topics  . Alcohol use: No  . Drug use: No     Allergies   Patient has no known allergies.   Review of Systems Review of Systems  Constitutional: Negative for fever.  HENT: Negative for sore throat.   Eyes: Negative for redness.  Respiratory: Positive for cough. Negative for shortness of breath.   Cardiovascular: Negative for chest pain.  Gastrointestinal: Negative for abdominal pain, diarrhea and vomiting.  Genitourinary: Negative for dysuria and flank pain.  Musculoskeletal: Positive for myalgias. Negative for back pain, neck pain and neck stiffness.  Skin: Negative for rash.  Neurological:       Intermittent mild headaches, gradual onset. No abrupt and/or severe headaches.   Hematological: Does not bruise/bleed easily.    Psychiatric/Behavioral: Negative for confusion.     Physical Exam Updated Vital Signs BP 129/83 (BP Location: Left Arm)   Pulse 95   Temp 99.7 F (37.6 C) (Oral)   Resp 18   Ht 1.778 m (5\' 10" )   Wt 117.9 kg   LMP 02/11/2014   SpO2 98%   BMI 37.31 kg/m   Physical Exam  Constitutional: She appears well-developed and well-nourished. No distress.  HENT:  Nose: Nose normal.  Mouth/Throat: Oropharynx is clear and moist.  No sinus or temporal tenderness.   Eyes: Pupils are equal, round, and reactive to light. Conjunctivae are normal. No scleral icterus.  Neck: Neck supple. No tracheal deviation present. No thyromegaly present.  No stiffness or rigidity. No pain w rom.   Cardiovascular: Normal rate, regular rhythm, normal heart sounds and intact distal pulses. Exam reveals no gallop and no friction rub.  No murmur heard. Pulmonary/Chest: Effort normal and breath sounds normal. No respiratory distress.  Abdominal: Soft. Normal appearance and bowel sounds are normal. She exhibits no distension. There is no tenderness.  Genitourinary:  Genitourinary Comments: No cva tenderness.   Musculoskeletal: She exhibits no edema.  Lymphadenopathy:    She has no cervical adenopathy.  Neurological: She is alert.  Speech clear/fluent. Steady gait.   Skin: Skin is warm and dry. No rash noted.  Psychiatric: She has a normal mood and affect.  Nursing note and vitals reviewed.    ED Treatments / Results  Labs (all labs ordered are listed, but only abnormal results are displayed) Labs Reviewed - No data to display  EKG None  Radiology Dg Chest 2 View  Result Date: 04/18/2018 CLINICAL DATA:  Fever, shortness of breath, chest pain and cough for 1 day, history asthma, hypertension EXAM: CHEST - 2 VIEW COMPARISON:  09/22/2017 FINDINGS: Normal heart size, mediastinal contours, and pulmonary vascularity. Lungs clear. No acute infiltrate, pleural effusion or pneumothorax. Bones unremarkable.  IMPRESSION: No acute abnormalities. Electronically Signed   By: Lavonia Dana M.D.   On: 04/18/2018 12:29    Procedures Procedures (including critical care time)  Medications Ordered in ED Medications - No data to display   Initial Impression / Assessment and Plan / ED Course  I have reviewed the triage vital signs and the nursing notes.  Pertinent labs & imaging results that were available during my care of the patient were reviewed by me and considered in my medical decision making (see chart for details).  Cxr.  Reviewed nursing notes and prior charts for additional history.  cxr reviewed - no pna.  Symptoms and exam felt most c/w viral uri.  Pt breathing comfortably, no distress.   Pt appears stable for d/c.     Final Clinical Impressions(s) / ED Diagnoses   Final diagnoses:  None    ED Discharge Orders    None       Lajean Saver, MD 04/18/18 1303

## 2018-04-18 NOTE — Discharge Instructions (Signed)
It was our pleasure to provide your ER care today - we hope that you feel better.  Your symptoms are most consistent with a viral or influenza like illness. Rest. Drink plenty of fluids. Take acetaminophen and/or ibuprofen as need for fever and body aches.  Follow up with primary care doctor in 1 week if symptoms fail to improve/resolve.  Return to ER if worse, new symptoms, increased trouble breathing, new or severe pain, other concern.

## 2018-04-18 NOTE — ED Triage Notes (Signed)
Pt reports body aches, chills and fever of 104 yesterday. Pt took ibuprofen at 9 this morning.

## 2018-04-18 NOTE — ED Notes (Signed)
Pt/family verbalized understanding of discharge instructions.   

## 2018-05-21 DIAGNOSIS — R739 Hyperglycemia, unspecified: Secondary | ICD-10-CM | POA: Diagnosis not present

## 2018-05-21 DIAGNOSIS — I10 Essential (primary) hypertension: Secondary | ICD-10-CM | POA: Diagnosis not present

## 2018-05-21 DIAGNOSIS — N390 Urinary tract infection, site not specified: Secondary | ICD-10-CM | POA: Diagnosis not present

## 2018-05-29 DIAGNOSIS — R7309 Other abnormal glucose: Secondary | ICD-10-CM | POA: Diagnosis not present

## 2018-05-29 DIAGNOSIS — I10 Essential (primary) hypertension: Secondary | ICD-10-CM | POA: Diagnosis not present

## 2018-05-29 DIAGNOSIS — J45909 Unspecified asthma, uncomplicated: Secondary | ICD-10-CM | POA: Diagnosis not present

## 2018-05-29 DIAGNOSIS — Z0000 Encounter for general adult medical examination without abnormal findings: Secondary | ICD-10-CM | POA: Diagnosis not present

## 2018-05-29 DIAGNOSIS — E785 Hyperlipidemia, unspecified: Secondary | ICD-10-CM | POA: Diagnosis not present

## 2018-06-29 ENCOUNTER — Encounter: Payer: Self-pay | Admitting: Pulmonary Disease

## 2018-06-29 ENCOUNTER — Ambulatory Visit (INDEPENDENT_AMBULATORY_CARE_PROVIDER_SITE_OTHER)
Admission: RE | Admit: 2018-06-29 | Discharge: 2018-06-29 | Disposition: A | Discharge status: Home / Self Care | Payer: BLUE CROSS/BLUE SHIELD | Source: Ambulatory Visit | Attending: Pulmonary Disease | Admitting: Pulmonary Disease

## 2018-06-29 ENCOUNTER — Ambulatory Visit (INDEPENDENT_AMBULATORY_CARE_PROVIDER_SITE_OTHER): Payer: BLUE CROSS/BLUE SHIELD | Admitting: Pulmonary Disease

## 2018-06-29 VITALS — BP 180/102 | HR 84 | Ht 70.0 in | Wt 284.2 lb

## 2018-06-29 DIAGNOSIS — J45998 Other asthma: Secondary | ICD-10-CM

## 2018-06-29 DIAGNOSIS — R05 Cough: Secondary | ICD-10-CM | POA: Diagnosis not present

## 2018-06-29 LAB — CBC WITH DIFFERENTIAL/PLATELET
BASOS PCT: 0.7 % (ref 0.0–3.0)
Basophils Absolute: 0 10*3/uL (ref 0.0–0.1)
EOS PCT: 3.4 % (ref 0.0–5.0)
Eosinophils Absolute: 0.2 10*3/uL (ref 0.0–0.7)
HEMATOCRIT: 37.6 % (ref 36.0–46.0)
Hemoglobin: 12.4 g/dL (ref 12.0–15.0)
Lymphocytes Relative: 39.8 % (ref 12.0–46.0)
Lymphs Abs: 2.6 10*3/uL (ref 0.7–4.0)
MCHC: 32.9 g/dL (ref 30.0–36.0)
MCV: 83 fl (ref 78.0–100.0)
MONOS PCT: 6.3 % (ref 3.0–12.0)
Monocytes Absolute: 0.4 10*3/uL (ref 0.1–1.0)
Neutro Abs: 3.2 10*3/uL (ref 1.4–7.7)
Neutrophils Relative %: 49.8 % (ref 43.0–77.0)
PLATELETS: 347 10*3/uL (ref 150.0–400.0)
RBC: 4.53 Mil/uL (ref 3.87–5.11)
RDW: 14.3 % (ref 11.5–15.5)
WBC: 6.5 10*3/uL (ref 4.0–10.5)

## 2018-06-29 LAB — NITRIC OXIDE: Nitric Oxide: 10

## 2018-06-29 MED ORDER — FLUTICASONE-UMECLIDIN-VILANT 100-62.5-25 MCG/INH IN AEPB: 1.0000 | INHALATION_SPRAY | Freq: Every day | RESPIRATORY_TRACT | 2 refills | Status: DC

## 2018-06-29 MED ORDER — FLUTICASONE-UMECLIDIN-VILANT 100-62.5-25 MCG/INH IN AEPB: 1.0000 | INHALATION_SPRAY | Freq: Every day | RESPIRATORY_TRACT | 0 refills | Status: DC

## 2018-06-29 NOTE — Patient Instructions (Signed)
Uncontrolled  We will get a CBC with differentials,, IgE levels Chest x-ray and pulmonary function tests Obtain a FeNo--markers of inflammation  We will switch her from Breo-we will provide a sample of Trelegy and provide a prescription-this is up-scaling  from [facility]  Follow-up on your thyroid function tests  We will see you back in the office in about 6 weeks  asthma Asthma, Adult  Asthma is a long-term (chronic) condition in which the airways get tight and narrow. The airways are the breathing passages that lead from the nose and mouth down into the lungs. A person with asthma will have times when symptoms get worse. These are called asthma attacks. They can cause coughing, whistling sounds when you breathe (wheezing), shortness of breath, and chest pain. They can make it hard to breathe. There is no cure for asthma, but medicines and lifestyle changes can help control it. There are many things that can bring on an asthma attack or make asthma symptoms worse (triggers). Common triggers include:  Mold.  Dust.  Cigarette smoke.  Cockroaches.  Things that can cause allergy symptoms (allergens). These include animal skin flakes (dander) and pollen from trees or grass.  Things that pollute the air. These may include household cleaners, wood smoke, smog, or chemical odors.  Cold air, weather changes, and wind.  Crying or laughing hard.  Stress.  Certain medicines or drugs.  Certain foods such as dried fruit, potato chips, and grape juice.  Infections, such as a cold or the flu.  Certain medical conditions or diseases.  Exercise or tiring activities. Asthma may be treated with medicines and by staying away from the things that cause asthma attacks. Types of medicines may include:  Controller medicines. These help prevent asthma symptoms. They are usually taken every day.  Fast-acting reliever or rescue medicines. These quickly relieve asthma symptoms. They are used as  needed and provide short-term relief.  Allergy medicines if your attacks are brought on by allergens.  Medicines to help control the body's defense (immune) system. Follow these instructions at home: Avoiding triggers in your home  Change your heating and air conditioning filter often.  Limit your use of fireplaces and wood stoves.  Get rid of pests (such as roaches and mice) and their droppings.  Throw away plants if you see mold on them.  Clean your floors. Dust regularly. Use cleaning products that do not smell.  Have someone vacuum when you are not home. Use a vacuum cleaner with a HEPA filter if possible.  Replace carpet with wood, tile, or vinyl flooring. Carpet can trap animal skin flakes and dust.  Use allergy-proof pillows, mattress covers, and box spring covers.  Wash bed sheets and blankets every week in hot water. Dry them in a dryer.  Keep your bedroom free of any triggers.  Avoid pets and keep windows closed when things that cause allergy symptoms are in the air.  Use blankets that are made of polyester or cotton.  Clean bathrooms and kitchens with bleach. If possible, have someone repaint the walls in these rooms with mold-resistant paint. Keep out of the rooms that are being cleaned and painted.  Wash your hands often with soap and water. If soap and water are not available, use hand sanitizer.  Do not allow anyone to smoke in your home. General instructions  Take over-the-counter and prescription medicines only as told by your doctor. ? Talk with your doctor if you have questions about how or when to take your  medicines. ? Make note if you need to use your medicines more often than usual.  Do not use any products that contain nicotine or tobacco, such as cigarettes and e-cigarettes. If you need help quitting, ask your doctor.  Stay away from secondhand smoke.  Avoid doing things outdoors when allergen counts are high and when air quality is  low.  Wear a ski mask when doing outdoor activities in the winter. The mask should cover your nose and mouth. Exercise indoors on cold days if you can.  Warm up before you exercise. Take time to cool down after exercise.  Use a peak flow meter as told by your doctor. A peak flow meter is a tool that measures how well the lungs are working.  Keep track of the peak flow meter's readings. Write them down.  Follow your asthma action plan. This is a written plan for taking care of your asthma and treating your attacks.  Make sure you get all the shots (vaccines) that your doctor recommends. Ask your doctor about a flu shot and a pneumonia shot.  Keep all follow-up visits as told by your doctor. This is important. Contact a doctor if:  You have wheezing, shortness of breath, or a cough even while taking medicine to prevent attacks.  The mucus you cough up (sputum) is thicker than usual.  The mucus you cough up changes from clear or white to yellow, green, gray, or bloody.  You have problems from the medicine you are taking, such as: ? A rash. ? Itching. ? Swelling. ? Trouble breathing.  You need reliever medicines more than 2-3 times a week.  Your peak flow reading is still at 50-79% of your personal best after following the action plan for 1 hour.  You have a fever. Get help right away if:  You seem to be worse and are not responding to medicine during an asthma attack.  You are short of breath even at rest.  You get short of breath when doing very little activity.  You have trouble eating, drinking, or talking.  You have chest pain or tightness.  You have a fast heartbeat.  Your lips or fingernails start to turn blue.  You are light-headed or dizzy, or you faint.  Your peak flow is less than 50% of your personal best.  You feel too tired to breathe normally. Summary  Asthma is a long-term (chronic) condition in which the airways get tight and narrow. An asthma  attack can make it hard to breathe.  Asthma cannot be cured, but medicines and lifestyle changes can help control it.  Make sure you understand how to avoid triggers and how and when to use your medicines. This information is not intended to replace advice given to you by your health care provider. Make sure you discuss any questions you have with your health care provider. Document Released: 11/20/2007 Document Revised: 07/08/2016 Document Reviewed: 07/08/2016 Elsevier Interactive Patient Education  2019 [employer].

## 2018-06-29 NOTE — Addendum Note (Signed)
Addended by: Madolyn Frieze on: 06/29/2018 02:45 PM   Modules accepted: Orders

## 2018-06-29 NOTE — Addendum Note (Signed)
Addended by: Suzzanne Cloud E on: 06/29/2018 02:40 PM   Modules accepted: Orders

## 2018-06-29 NOTE — Progress Notes (Signed)
Kristina Washington    [id number]    1960-10-21  Primary Care Physician:Kim, Jeneen Rinks, MD  Referring Physician: Deland Pretty, MD [id number] [place], [place] [address]  Chief complaint:  Patient with a history of uncontrolled asthma  HPI:  Daily use of albuterol No specific known trigger Currently on Breo  There has been no recent change in work or home environment to account for the exacerbation of symptoms Has had bronchitis many times in the past  Never smoker  Outpatient Encounter Medications as of 06/29/2018  Medication Sig  . albuterol (PROVENTIL HFA;VENTOLIN HFA) 108 (90 Base) MCG/ACT inhaler Inhale 2 puffs into the lungs every 4 (four) hours as needed for wheezing.  Marland Kitchen atorvastatin (LIPITOR) 40 MG tablet Take 40 mg by mouth daily.  . Cyanocobalamin (VITAMIN B 12 PO) Take by mouth.  . cyclobenzaprine (FLEXERIL) 10 MG tablet Take 1 tablet (10 mg total) by mouth 3 (three) times daily as needed for muscle spasms.  . fluticasone furoate-vilanterol (BREO ELLIPTA) 200-25 MCG/INH AEPB Inhale 1 puff into the lungs daily.  Marland Kitchen guaiFENesin (MUCINEX) 600 MG 12 hr tablet Take 1 tablet (600 mg total) by mouth 2 (two) times daily.  . meloxicam (MOBIC) 15 MG tablet Take 15 mg by mouth daily.  . montelukast (SINGULAIR) 10 MG tablet Take 1 tablet (10 mg total) by mouth daily.  . Multiple Vitamin (MULTIVITAMIN) tablet Take 1 tablet by mouth daily.  Marland Kitchen triamterene-hydrochlorothiazide (MAXZIDE-25) 37.5-25 MG tablet Take 1 tablet by mouth daily.   No facility-administered encounter medications on file as of 06/29/2018.     Allergies as of 06/29/2018  . (No Known Allergies)    Past Medical History:  Diagnosis Date  . Bronchial asthma   . Cataracts, bilateral   . Hypercholesterolemia   . Hypertension   . Menorrhagia     Past Surgical History:  Procedure Laterality Date  . ENDOMETRIAL ABLATION  03/23/10  . FOOT SURGERY     BOTH RIGHT AND LEFT  . KNEE  ARTHROSCOPY  2002   right knee  . TUBAL LIGATION      Family History  Problem Relation Age of Onset  . Hypertension Father   . Cancer Father        COLON  . Diabetes Mother   . Hypertension Mother   . Cancer Mother   . Diabetes Sister   . Hypertension Sister   . Diabetes Paternal Aunt   . Diabetes Paternal Uncle     Social History   Socioeconomic History  . Marital status: Divorced    Spouse name: Not on file  . Number of children: Not on file  . Years of education: Not on file  . Highest education level: Not on file  Occupational History  . Not on file  Social Needs  . Financial resource strain: Not on file  . Food insecurity:    Worry: Not on file    Inability: Not on file  . Transportation needs:    Medical: Not on file    Non-medical: Not on file  Tobacco Use  . Smoking status: Never Smoker  . Smokeless tobacco: Never Used  Substance and Sexual Activity  . Alcohol use: No  . Drug use: No  . Sexual activity: Yes    Birth control/protection: Condom    Comment: intercourse age 89, more than 5 sexual partners, des neg  Lifestyle  . Physical activity:    Days per week: Not on  file    Minutes per session: Not on file  . Stress: Not on file  Relationships  . Social connections:    Talks on phone: Not on file    Gets together: Not on file    Attends religious service: Not on file    Active member of club or organization: Not on file    Attends meetings of clubs or organizations: Not on file    Relationship status: Not on file  . Intimate partner violence:    Fear of current or ex partner: Not on file    Emotionally abused: Not on file    Physically abused: Not on file    Forced sexual activity: Not on file  Other Topics Concern  . Not on file  Social History Narrative  . Not on file    Review of Systems  Constitutional: Negative.   HENT: Negative.   Eyes: Negative.   Respiratory: Positive for shortness of breath.   Cardiovascular: Negative.     Gastrointestinal: Negative.   Endocrine: Negative.   Genitourinary: Negative.     Vitals:   06/29/18 1346  BP: (!) 180/102  Pulse: 84  SpO2: 96%     Physical Exam  Constitutional: She appears well-developed and well-nourished.  HENT:  Head: Atraumatic.  Eyes: Pupils are equal, round, and reactive to light. Conjunctivae and EOM are normal. Right eye exhibits no discharge. Left eye exhibits no discharge.  Neck: Normal range of motion. Neck supple. No tracheal deviation present. No thyromegaly present.  Cardiovascular: Normal rate and regular rhythm.  Pulmonary/Chest: Effort normal. No respiratory distress. She has no wheezes. She has no rales.  Abdominal: Soft. Bowel sounds are normal. She exhibits no distension. There is no abdominal tenderness.  Skin: She is not diaphoretic.   Data Reviewed: Recent ED records reviewed  Assessment:  Uncontrolled asthma  Persistent symptoms with increased use of albuterol  FeNO only 10  uncontrolled hypertension  Plan/Recommendations:  Trial with Trelegy-samples provided  We will obtain a chest x-ray, obtain a pulmonary function study  Obtain CBC, eosinophil count IgE levels  I will see her back in the office in 4 to 6 weeks   Sherrilyn Rist MD [facility] 06/29/2018, 1:48 PM  CC: Deland Pretty, MD

## 2018-06-30 LAB — IGE: IgE (Immunoglobulin E), Serum: 147 kU/L — ABNORMAL HIGH (ref <=114)

## 2018-08-03 DIAGNOSIS — M1711 Unilateral primary osteoarthritis, right knee: Secondary | ICD-10-CM | POA: Diagnosis not present

## 2018-08-12 ENCOUNTER — Telehealth: Payer: Self-pay | Admitting: Pulmonary Disease

## 2018-08-12 ENCOUNTER — Ambulatory Visit: Payer: BLUE CROSS/BLUE SHIELD | Admitting: Pulmonary Disease

## 2018-08-18 DIAGNOSIS — R7303 Prediabetes: Secondary | ICD-10-CM | POA: Diagnosis not present

## 2018-08-18 DIAGNOSIS — Z6841 Body Mass Index (BMI) 40.0 and over, adult: Secondary | ICD-10-CM | POA: Diagnosis not present

## 2018-08-24 DIAGNOSIS — I10 Essential (primary) hypertension: Secondary | ICD-10-CM | POA: Diagnosis not present

## 2018-08-24 DIAGNOSIS — R7309 Other abnormal glucose: Secondary | ICD-10-CM | POA: Diagnosis not present

## 2018-08-28 DIAGNOSIS — Z01818 Encounter for other preprocedural examination: Secondary | ICD-10-CM | POA: Diagnosis not present

## 2018-08-28 DIAGNOSIS — Z6841 Body Mass Index (BMI) 40.0 and over, adult: Secondary | ICD-10-CM | POA: Diagnosis not present

## 2018-08-28 DIAGNOSIS — R739 Hyperglycemia, unspecified: Secondary | ICD-10-CM | POA: Diagnosis not present

## 2018-08-28 DIAGNOSIS — I10 Essential (primary) hypertension: Secondary | ICD-10-CM | POA: Diagnosis not present

## 2018-08-28 DIAGNOSIS — E782 Mixed hyperlipidemia: Secondary | ICD-10-CM | POA: Diagnosis not present

## 2018-08-28 DIAGNOSIS — E785 Hyperlipidemia, unspecified: Secondary | ICD-10-CM | POA: Diagnosis not present

## 2018-09-09 DIAGNOSIS — Z6841 Body Mass Index (BMI) 40.0 and over, adult: Secondary | ICD-10-CM | POA: Diagnosis not present

## 2018-09-09 DIAGNOSIS — E782 Mixed hyperlipidemia: Secondary | ICD-10-CM | POA: Diagnosis not present

## 2018-09-22 NOTE — Telephone Encounter (Signed)
None

## 2018-09-24 DIAGNOSIS — E782 Mixed hyperlipidemia: Secondary | ICD-10-CM | POA: Diagnosis not present

## 2018-09-24 DIAGNOSIS — R7303 Prediabetes: Secondary | ICD-10-CM | POA: Diagnosis not present

## 2018-09-24 DIAGNOSIS — Z6841 Body Mass Index (BMI) 40.0 and over, adult: Secondary | ICD-10-CM | POA: Diagnosis not present

## 2018-11-04 IMAGING — DX DG CHEST 2V
2 series · 2 of 2 positions shown · non-contrast
Comparison: 09/22/2017

CLINICAL DATA: Fever, shortness of breath, chest pain and cough for
1 day, history asthma, hypertension

EXAM:
CHEST - 2 VIEW

[chest pa]
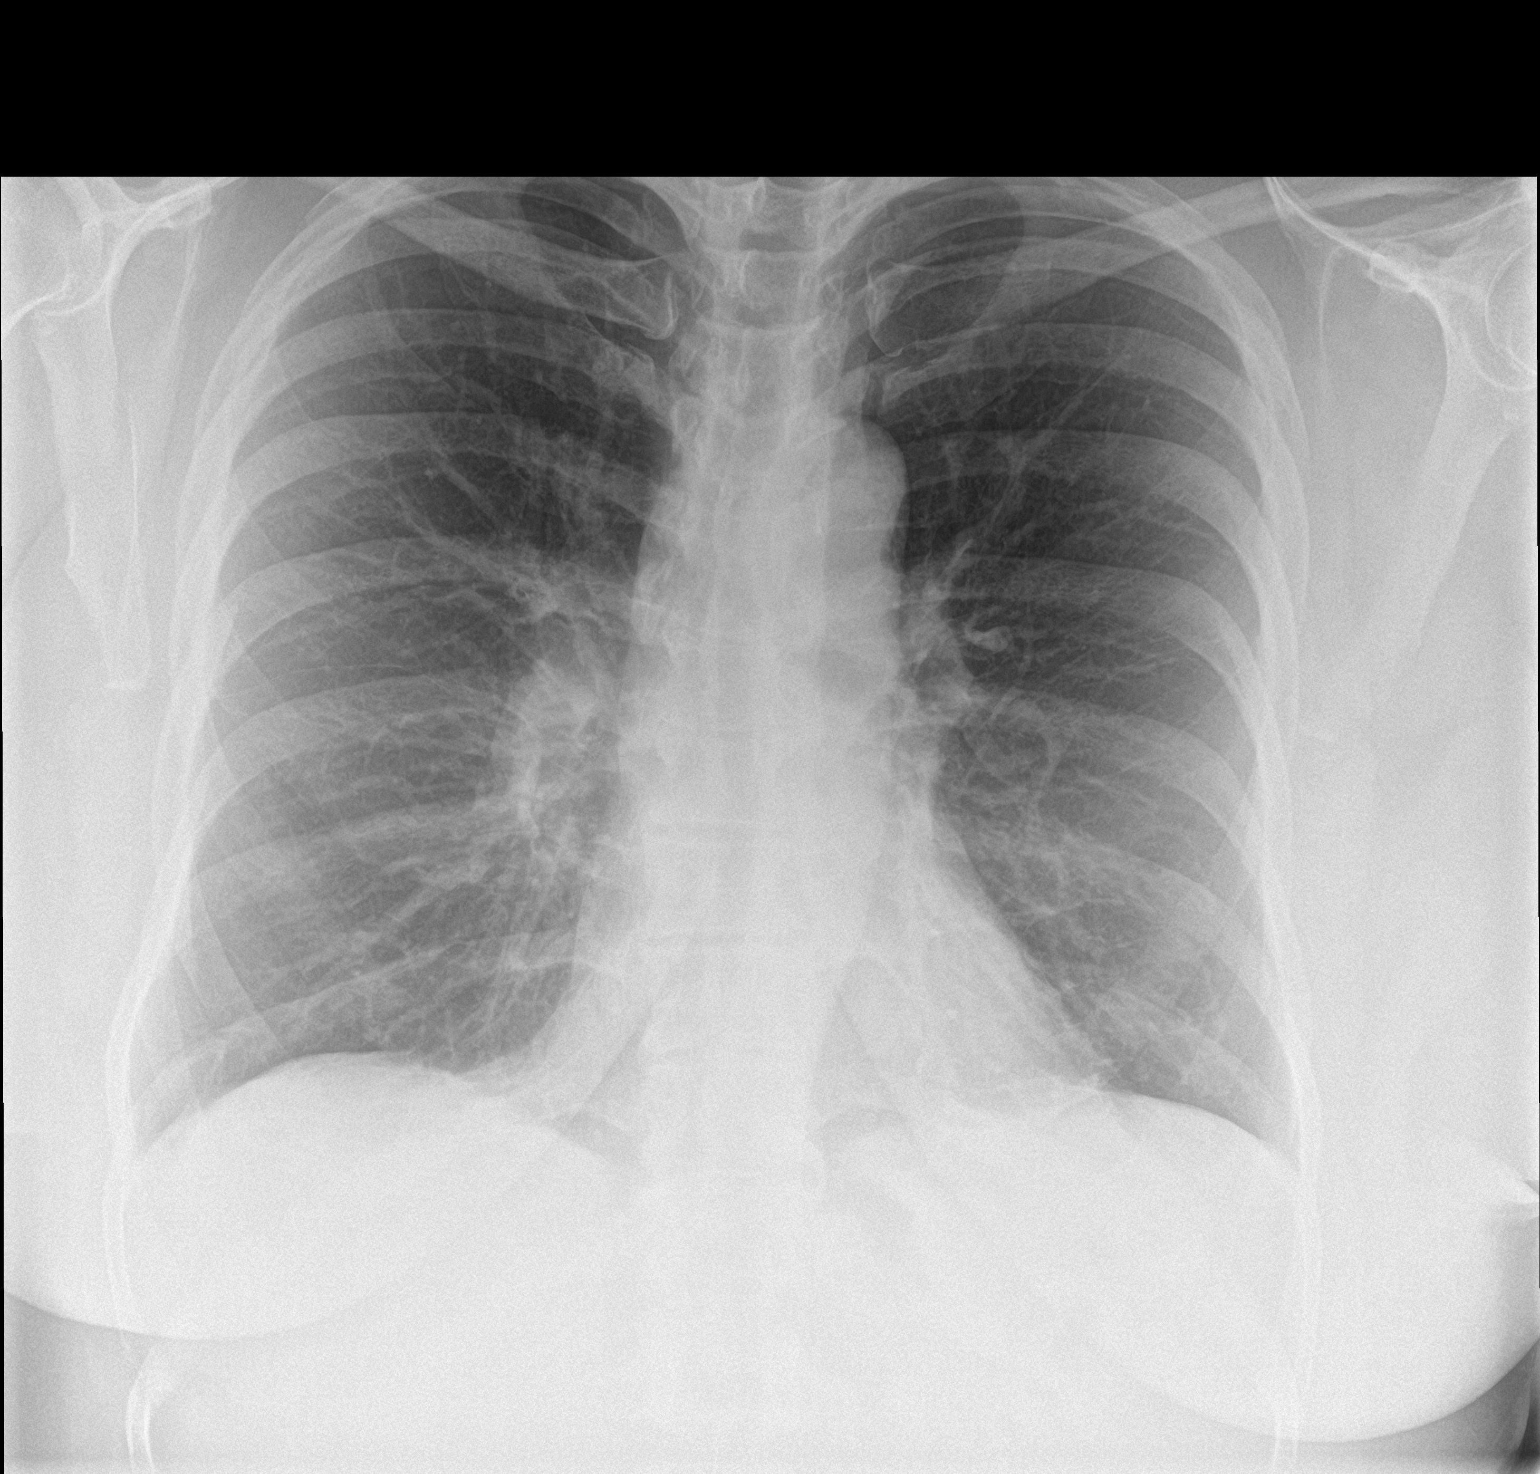

[chest lat]
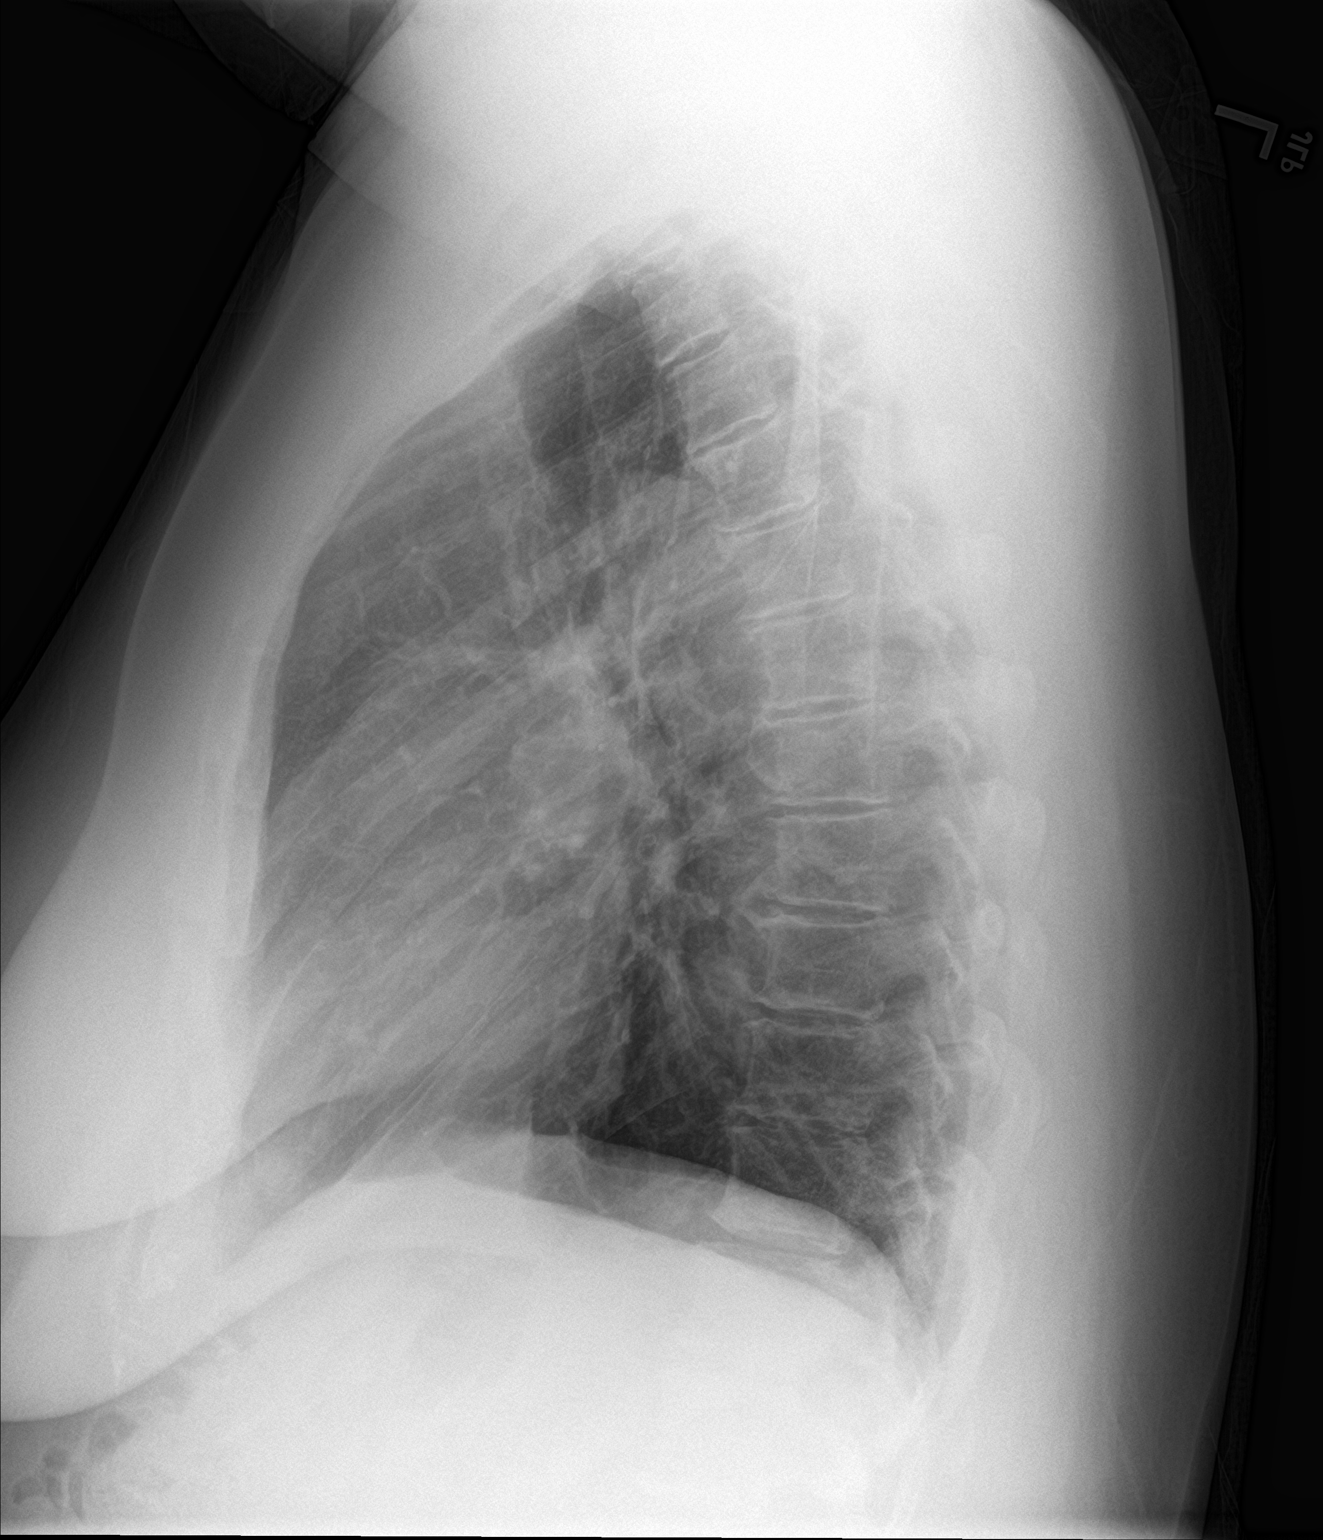

[2 of 2 positions shown; findings below may reference images not displayed]

FINDINGS: Normal heart size, mediastinal contours, and pulmonary vascularity.

Lungs clear.

No acute infiltrate, pleural effusion or pneumothorax.

Bones unremarkable.
IMPRESSION: No acute abnormalities.

## 2018-11-25 ENCOUNTER — Telehealth: Payer: Self-pay | Admitting: Pulmonary Disease

## 2018-11-25 NOTE — Telephone Encounter (Signed)
Going through Dr Judson Roch paperwork  [facility] faxed a surgical clearance to this office 2nd request on 5.15.2020  [facility] TCB x1 for patient to schedule either MyChart video visit or face-to-face for surgical clearance  Called [facility] and spoke with [facility] @ [phone] to apologize for the delay and to inform her that we have reached out to patient to schedule ov for the surgical clearance  Will await call back from patient to schedule visit

## 2018-12-22 NOTE — Telephone Encounter (Signed)
[facility] TCB x2

## 2018-12-29 NOTE — Telephone Encounter (Signed)
[facility] TCB x3 Will sign off and await call back from patient

## 2019-01-04 DIAGNOSIS — J0100 Acute maxillary sinusitis, unspecified: Secondary | ICD-10-CM | POA: Diagnosis not present

## 2019-01-04 DIAGNOSIS — H8110 Benign paroxysmal vertigo, unspecified ear: Secondary | ICD-10-CM | POA: Diagnosis not present

## 2019-01-11 DIAGNOSIS — R7309 Other abnormal glucose: Secondary | ICD-10-CM | POA: Diagnosis not present

## 2019-01-11 DIAGNOSIS — I10 Essential (primary) hypertension: Secondary | ICD-10-CM | POA: Diagnosis not present

## 2019-01-11 DIAGNOSIS — R42 Dizziness and giddiness: Secondary | ICD-10-CM | POA: Diagnosis not present

## 2019-01-12 ENCOUNTER — Encounter: Payer: BLUE CROSS/BLUE SHIELD | Admitting: Women's Health

## 2019-01-19 DIAGNOSIS — R7309 Other abnormal glucose: Secondary | ICD-10-CM | POA: Diagnosis not present

## 2019-01-19 DIAGNOSIS — I10 Essential (primary) hypertension: Secondary | ICD-10-CM | POA: Diagnosis not present

## 2019-01-25 ENCOUNTER — Encounter: Payer: Self-pay | Admitting: Pulmonary Disease

## 2019-01-25 ENCOUNTER — Ambulatory Visit (INDEPENDENT_AMBULATORY_CARE_PROVIDER_SITE_OTHER): Payer: BC Managed Care – PPO | Admitting: Pulmonary Disease

## 2019-01-25 ENCOUNTER — Other Ambulatory Visit: Payer: Self-pay

## 2019-01-25 VITALS — BP 120/82 | HR 88 | Temp 98.7°F | Ht 70.0 in | Wt 286.9 lb

## 2019-01-25 DIAGNOSIS — J45909 Unspecified asthma, uncomplicated: Secondary | ICD-10-CM

## 2019-01-25 NOTE — Progress Notes (Signed)
Kristina Washington    [id number]    18-Oct-1960  Primary Care Physician:Kim, Jeneen Rinks, MD  Referring Physician: Jani Gravel, MD [address],  [place] [address]  Chief complaint:  History of asthma Controlled  HPI:  No specific known trigger Currently on Breo-symptoms are well controlled at present -Has not been needing a rescue inhaler  There has been no recent change in work or home environment to account for the exacerbation of symptoms Has had bronchitis many times in the past  Never smoker  Being evaluated for knee surgery  Outpatient Encounter Medications as of 01/25/2019  Medication Sig  . albuterol (PROVENTIL HFA;VENTOLIN HFA) 108 (90 Base) MCG/ACT inhaler Inhale 2 puffs into the lungs every 4 (four) hours as needed for wheezing.  Marland Kitchen atorvastatin (LIPITOR) 40 MG tablet Take 40 mg by mouth daily.  . Cyanocobalamin (VITAMIN B 12 PO) Take by mouth.  . cyclobenzaprine (FLEXERIL) 10 MG tablet Take 1 tablet (10 mg total) by mouth 3 (three) times daily as needed for muscle spasms.  . fluticasone furoate-vilanterol (BREO ELLIPTA) 200-25 MCG/INH AEPB Inhale 1 puff into the lungs daily.  . Fluticasone-Umeclidin-Vilant (TRELEGY ELLIPTA) 100-62.5-25 MCG/INH AEPB Inhale 1 puff into the lungs daily.  . Fluticasone-Umeclidin-Vilant (TRELEGY ELLIPTA) 100-62.5-25 MCG/INH AEPB Inhale 1 puff into the lungs daily.  Marland Kitchen guaiFENesin (MUCINEX) 600 MG 12 hr tablet Take 1 tablet (600 mg total) by mouth 2 (two) times daily.  . meloxicam (MOBIC) 15 MG tablet Take 15 mg by mouth daily.  . montelukast (SINGULAIR) 10 MG tablet Take 1 tablet (10 mg total) by mouth daily.  . Multiple Vitamin (MULTIVITAMIN) tablet Take 1 tablet by mouth daily.  Marland Kitchen triamterene-hydrochlorothiazide (MAXZIDE-25) 37.5-25 MG tablet Take 1 tablet by mouth daily.   No facility-administered encounter medications on file as of 01/25/2019.     Allergies as of 01/25/2019  . (No Known Allergies)    Past  Medical History:  Diagnosis Date  . Bronchial asthma   . Cataracts, bilateral   . Hypercholesterolemia   . Hypertension   . Menorrhagia     Past Surgical History:  Procedure Laterality Date  . ENDOMETRIAL ABLATION  03/23/10  . FOOT SURGERY     BOTH RIGHT AND LEFT  . KNEE ARTHROSCOPY  2002   right knee  . TUBAL LIGATION      Family History  Problem Relation Age of Onset  . Hypertension Father   . Cancer Father        COLON  . Diabetes Mother   . Hypertension Mother   . Cancer Mother   . Diabetes Sister   . Hypertension Sister   . Diabetes Paternal Aunt   . Diabetes Paternal Uncle     Social History   Socioeconomic History  . Marital status: Divorced    Spouse name: Not on file  . Number of children: Not on file  . Years of education: Not on file  . Highest education level: Not on file  Occupational History  . Not on file  Social Needs  . Financial resource strain: Not on file  . Food insecurity    Worry: Not on file    Inability: Not on file  . Transportation needs    Medical: Not on file    Non-medical: Not on file  Tobacco Use  . Smoking status: Never Smoker  . Smokeless tobacco: Never Used  Substance and Sexual Activity  . Alcohol use: No  .  Drug use: No  . Sexual activity: Yes    Birth control/protection: Condom    Comment: intercourse age 49, more than 5 sexual partners, des neg  Lifestyle  . Physical activity    Days per week: Not on file    Minutes per session: Not on file  . Stress: Not on file  Relationships  . Social Herbalist on phone: Not on file    Gets together: Not on file    Attends religious service: Not on file    Active member of club or organization: Not on file    Attends meetings of clubs or organizations: Not on file    Relationship status: Not on file  . Intimate partner violence    Fear of current or ex partner: Not on file    Emotionally abused: Not on file    Physically abused: Not on file    Forced  sexual activity: Not on file  Other Topics Concern  . Not on file  Social History Narrative  . Not on file    Review of Systems  Constitutional: Negative.   HENT: Negative.   Eyes: Negative.   Respiratory: Negative for shortness of breath.   Cardiovascular: Negative.   Gastrointestinal: Negative.   Endocrine: Negative.   Genitourinary: Negative.     Vitals:   01/25/19 1609  BP: 120/82  Pulse: 88  Temp: 98.7 F (37.1 C)  SpO2: 95%     Physical Exam  Constitutional: She appears well-developed and well-nourished.  HENT:  Head: Atraumatic.  Neck: Normal range of motion. Neck supple. No tracheal deviation present. No thyromegaly present.  Cardiovascular: Normal rate and regular rhythm.  Pulmonary/Chest: Effort normal. No respiratory distress. She has no wheezes.  Abdominal: Soft. Bowel sounds are normal. She exhibits no distension. There is no abdominal tenderness.  Skin: She is not diaphoretic.   Data Reviewed:   Assessment:  Asthma appears well controlled Hypertension   Plan/Recommendations:  Asthma well-controlled symptoms  Continue maintenance inhalers  Albuterol use as needed  She is cleared for knee surgery from a respiratory perspective  Sherrilyn Rist MD [facility] 01/25/2019, 4:29 PM  CC: Jani Gravel, MD

## 2019-01-25 NOTE — Patient Instructions (Signed)
Cleared for surgery from a respiratory perspective  Continue use of Breo Rescue inhaler as needed  Call with significant concerns  We will see you back in about a year

## 2019-01-26 ENCOUNTER — Encounter: Payer: Self-pay | Admitting: Women's Health

## 2019-01-26 ENCOUNTER — Ambulatory Visit (INDEPENDENT_AMBULATORY_CARE_PROVIDER_SITE_OTHER): Payer: BC Managed Care – PPO | Admitting: Women's Health

## 2019-01-26 VITALS — BP 118/80 | Ht 70.0 in | Wt 287.0 lb

## 2019-01-26 DIAGNOSIS — Z01419 Encounter for gynecological examination (general) (routine) without abnormal findings: Secondary | ICD-10-CM | POA: Diagnosis not present

## 2019-01-26 DIAGNOSIS — E785 Hyperlipidemia, unspecified: Secondary | ICD-10-CM | POA: Diagnosis not present

## 2019-01-26 DIAGNOSIS — Z0000 Encounter for general adult medical examination without abnormal findings: Secondary | ICD-10-CM | POA: Diagnosis not present

## 2019-01-26 DIAGNOSIS — Z1231 Encounter for screening mammogram for malignant neoplasm of breast: Secondary | ICD-10-CM | POA: Diagnosis not present

## 2019-01-26 DIAGNOSIS — Z1239 Encounter for other screening for malignant neoplasm of breast: Secondary | ICD-10-CM | POA: Diagnosis not present

## 2019-01-26 DIAGNOSIS — E7800 Pure hypercholesterolemia, unspecified: Secondary | ICD-10-CM | POA: Diagnosis not present

## 2019-01-26 DIAGNOSIS — Z23 Encounter for immunization: Secondary | ICD-10-CM | POA: Diagnosis not present

## 2019-01-26 DIAGNOSIS — R739 Hyperglycemia, unspecified: Secondary | ICD-10-CM | POA: Diagnosis not present

## 2019-01-26 DIAGNOSIS — I10 Essential (primary) hypertension: Secondary | ICD-10-CM | POA: Diagnosis not present

## 2019-01-26 NOTE — Patient Instructions (Signed)

## 2019-01-26 NOTE — Progress Notes (Signed)
Kristina Washington Apr 24, 1961 [id number]    History:    Presents for annual exam.  Postmenopausal greater than 1 year with no bleeding.  Occasional hot flushes.  History of a BTL and endometrial ablation.  Normal Pap and mammogram history.  Has had mammograms at a center in [facility] reports all have been normal.  Benign colon polyp 2017 5-year follow-up.  Primary care manages hypertension, hypercholesteremia, diabetes and asthma.  Is scheduled for knee replacement next month.  20 pound weight gain in the past year reports relates to 2 different episodes of being on high-dose steroids for asthma.  Not sexually active greater than 1 year denies need for STD screen.  Past medical history, past surgical history, family history and social history were all reviewed and documented in the EPIC chart.  Working from home for [employer].  2 children both doing well, daughter has lupus, son is 71.  ROS:  A ROS was performed and pertinent positives and negatives are included.  Exam:  Vitals:   01/26/19 0849  BP: 118/80  Weight: 287 lb (130.2 kg)  Height: 5\' 10"  (1.778 m)   Body mass index is 41.18 kg/m.   General appearance:  Normal Thyroid:  Symmetrical, normal in size, without palpable masses or nodularity. Respiratory  Auscultation:  Clear without wheezing or rhonchi Cardiovascular  Auscultation:  Regular rate, without rubs, murmurs or gallops  Edema/varicosities:  Not grossly evident Abdominal  Soft,nontender, without masses, guarding or rebound.  Liver/spleen:  No organomegaly noted  Hernia:  None appreciated  Skin  Inspection:  Grossly normal   Breasts: Examined lying and sitting.     Right: Without masses, retractions, discharge or axillary adenopathy.     Left: Without masses, retractions, discharge or axillary adenopathy. Gentitourinary   Inguinal/mons:  Normal without inguinal adenopathy  External genitalia:  Normal  BUS/Urethra/Skene's glands:  Normal  Vagina:   Normal  Cervix:  Normal  Uterus:  normal in size, shape and contour.  Midline and mobile  Adnexa/parametria:     Rt: Without masses or tenderness.   Lt: Without masses or tenderness.  Anus and perineum: Normal  Digital rectal exam: Normal sphincter tone without palpated masses or tenderness  Assessment/Plan:  58 y.o. SBF G2 P2 for annual exam with no complaints.  Postmenopausal/no HRT/no bleeding Hypertension, diabetes (diet), hypercholesteremia, asthma-primary care manages labs and meds Scheduled for right knee replacement next month Morbid obesity  Plan: Aware of need to decrease calorie/carbs, walk as able, weightbearing as well as balance type exercise encouraged.  SBEs, annual screening mammogram, instructed to have mammogram report faxed to our office.  Instructed to call if any bleeding, now 14 months postmenopausal.  Condoms encouraged if sexually active.  Pap normal 2018, new screening guidelines reviewed.   Kristina Washington, 8:56 AM 01/26/2019

## 2019-02-19 DIAGNOSIS — R635 Abnormal weight gain: Secondary | ICD-10-CM | POA: Diagnosis not present

## 2019-03-04 DIAGNOSIS — E119 Type 2 diabetes mellitus without complications: Secondary | ICD-10-CM | POA: Diagnosis not present

## 2019-03-04 DIAGNOSIS — R635 Abnormal weight gain: Secondary | ICD-10-CM | POA: Diagnosis not present

## 2019-03-04 DIAGNOSIS — I440 Atrioventricular block, first degree: Secondary | ICD-10-CM | POA: Diagnosis not present

## 2019-03-04 DIAGNOSIS — E785 Hyperlipidemia, unspecified: Secondary | ICD-10-CM | POA: Diagnosis not present

## 2019-03-04 DIAGNOSIS — I10 Essential (primary) hypertension: Secondary | ICD-10-CM | POA: Diagnosis not present

## 2019-03-17 DIAGNOSIS — R635 Abnormal weight gain: Secondary | ICD-10-CM | POA: Diagnosis not present

## 2019-03-17 DIAGNOSIS — E119 Type 2 diabetes mellitus without complications: Secondary | ICD-10-CM | POA: Diagnosis not present

## 2019-03-17 DIAGNOSIS — Z6841 Body Mass Index (BMI) 40.0 and over, adult: Secondary | ICD-10-CM | POA: Diagnosis not present

## 2019-03-17 DIAGNOSIS — I10 Essential (primary) hypertension: Secondary | ICD-10-CM | POA: Diagnosis not present

## 2019-03-22 DIAGNOSIS — Z6841 Body Mass Index (BMI) 40.0 and over, adult: Secondary | ICD-10-CM | POA: Diagnosis not present

## 2019-03-22 DIAGNOSIS — Z713 Dietary counseling and surveillance: Secondary | ICD-10-CM | POA: Diagnosis not present

## 2019-03-23 ENCOUNTER — Other Ambulatory Visit: Payer: Self-pay

## 2019-03-23 DIAGNOSIS — Z20822 Contact with and (suspected) exposure to covid-19: Secondary | ICD-10-CM

## 2019-03-23 DIAGNOSIS — Z20828 Contact with and (suspected) exposure to other viral communicable diseases: Secondary | ICD-10-CM | POA: Diagnosis not present

## 2019-03-26 LAB — NOVEL CORONAVIRUS, NAA: SARS-CoV-2, NAA: NOT DETECTED

## 2019-03-30 DIAGNOSIS — E119 Type 2 diabetes mellitus without complications: Secondary | ICD-10-CM | POA: Diagnosis not present

## 2019-03-30 DIAGNOSIS — G4730 Sleep apnea, unspecified: Secondary | ICD-10-CM | POA: Diagnosis not present

## 2019-03-30 DIAGNOSIS — Z7984 Long term (current) use of oral hypoglycemic drugs: Secondary | ICD-10-CM | POA: Diagnosis not present

## 2019-03-30 DIAGNOSIS — E785 Hyperlipidemia, unspecified: Secondary | ICD-10-CM | POA: Diagnosis not present

## 2019-04-13 DIAGNOSIS — R635 Abnormal weight gain: Secondary | ICD-10-CM | POA: Diagnosis not present

## 2019-04-13 DIAGNOSIS — E785 Hyperlipidemia, unspecified: Secondary | ICD-10-CM | POA: Diagnosis not present

## 2019-04-13 DIAGNOSIS — E119 Type 2 diabetes mellitus without complications: Secondary | ICD-10-CM | POA: Diagnosis not present

## 2019-04-13 DIAGNOSIS — I10 Essential (primary) hypertension: Secondary | ICD-10-CM | POA: Diagnosis not present

## 2019-04-20 DIAGNOSIS — G4730 Sleep apnea, unspecified: Secondary | ICD-10-CM | POA: Diagnosis not present

## 2019-04-23 ENCOUNTER — Other Ambulatory Visit: Payer: Self-pay

## 2019-04-23 DIAGNOSIS — Z713 Dietary counseling and surveillance: Secondary | ICD-10-CM | POA: Diagnosis not present

## 2019-04-23 DIAGNOSIS — Z20822 Contact with and (suspected) exposure to covid-19: Secondary | ICD-10-CM

## 2019-04-24 LAB — NOVEL CORONAVIRUS, NAA: SARS-CoV-2, NAA: NOT DETECTED

## 2019-05-04 DIAGNOSIS — E669 Obesity, unspecified: Secondary | ICD-10-CM | POA: Diagnosis not present

## 2019-06-03 DIAGNOSIS — M1711 Unilateral primary osteoarthritis, right knee: Secondary | ICD-10-CM | POA: Diagnosis not present

## 2019-07-20 ENCOUNTER — Ambulatory Visit: Payer: 59 | Attending: Internal Medicine

## 2019-07-20 DIAGNOSIS — Z20822 Contact with and (suspected) exposure to covid-19: Secondary | ICD-10-CM

## 2019-07-21 LAB — NOVEL CORONAVIRUS, NAA: SARS-CoV-2, NAA: NOT DETECTED

## 2019-08-02 ENCOUNTER — Ambulatory Visit: Payer: Self-pay | Admitting: Physician Assistant

## 2019-08-02 NOTE — H&P (View-Only) (Signed)
TOTAL KNEE ADMISSION H&P  Patient is being admitted for right total knee arthroplasty.  Subjective:  Chief Complaint:right knee pain.  HPI: Kristina Washington, 59 y.o. female, has a history of pain and functional disability in the right knee due to arthritis and has failed non-surgical conservative treatments for greater than 12 weeks to includeNSAID's and/or analgesics, corticosteriod injections and activity modification.  Onset of symptoms was gradual, starting 6 years ago with gradually worsening course since that time. The patient noted no past surgery on the right knee(s).  Patient currently rates pain in the right knee(s) at 10 out of 10 with activity. Patient has night pain, worsening of pain with activity and weight bearing, pain that interferes with activities of daily living, pain with passive range of motion, crepitus and joint swelling.  Patient has evidence of periarticular osteophytes, joint subluxation and joint space narrowing by imaging studies.There is no active infection.  Patient Active Problem List   Diagnosis Date Noted  . Hypertension 01/26/2019  . Hypercholesteremia 01/26/2019  . Obesity, morbid ([place]) 03/10/2013  . Fibroid uterus 09/16/2012  . Bronchial asthma    Past Medical History:  Diagnosis Date  . Bronchial asthma   . Cataracts, bilateral   . Hypercholesterolemia   . Hypertension   . Menorrhagia     Past Surgical History:  Procedure Laterality Date  . ENDOMETRIAL ABLATION  03/23/10  . FOOT SURGERY     BOTH RIGHT AND LEFT  . KNEE ARTHROSCOPY  2002   right knee  . TUBAL LIGATION      Current Outpatient Medications  Medication Sig Dispense Refill Last Dose  . albuterol (PROVENTIL HFA;VENTOLIN HFA) 108 (90 Base) MCG/ACT inhaler Inhale 2 puffs into the lungs every 4 (four) hours as needed for wheezing. 1 Inhaler 1   . atorvastatin (LIPITOR) 40 MG tablet Take 40 mg by mouth daily.     . Cyanocobalamin (VITAMIN B 12 PO) Take by mouth.     . cyclobenzaprine  (FLEXERIL) 10 MG tablet Take 1 tablet (10 mg total) by mouth 3 (three) times daily as needed for muscle spasms. 15 tablet 0   . fluticasone furoate-vilanterol (BREO ELLIPTA) 200-25 MCG/INH AEPB Inhale 1 puff into the lungs daily. 28 each 0   . Fluticasone-Umeclidin-Vilant (TRELEGY ELLIPTA) 100-62.5-25 MCG/INH AEPB Inhale 1 puff into the lungs daily. 2 each 0   . guaiFENesin (MUCINEX) 600 MG 12 hr tablet Take 1 tablet (600 mg total) by mouth 2 (two) times daily. 6 tablet 0   . meloxicam (MOBIC) 15 MG tablet Take 15 mg by mouth daily.     . montelukast (SINGULAIR) 10 MG tablet Take 1 tablet (10 mg total) by mouth daily. 30 tablet 0   . Multiple Vitamin (MULTIVITAMIN) tablet Take 1 tablet by mouth daily.     Marland Kitchen triamterene-hydrochlorothiazide (MAXZIDE-25) 37.5-25 MG tablet Take 1 tablet by mouth daily.      No current facility-administered medications for this visit.   No Known Allergies  Social History   Tobacco Use  . Smoking status: Never Smoker  . Smokeless tobacco: Never Used  Substance Use Topics  . Alcohol use: Yes    Family History  Problem Relation Age of Onset  . Hypertension Father   . Cancer Father        COLON  . Diabetes Mother   . Hypertension Mother   . Cancer Mother   . Diabetes Sister   . Hypertension Sister   . Diabetes Paternal Aunt   . Diabetes Paternal  Uncle      Review of Systems  Cardiovascular: Positive for leg swelling.  Musculoskeletal: Positive for arthralgias and joint swelling.  Neurological: Positive for dizziness, seizures and headaches.  All other systems reviewed and are negative.   Objective:  Physical Exam  Constitutional: She is oriented to person, place, and time. She appears well-developed and well-nourished. No distress.  HENT:  Head: Normocephalic and atraumatic.  Nose: Nose normal.  Eyes: Pupils are equal, round, and reactive to light. Conjunctivae and EOM are normal.  Cardiovascular: Normal rate, regular rhythm, normal heart  sounds and intact distal pulses.  Respiratory: Effort normal and breath sounds normal. No respiratory distress. She has no wheezes.  GI: Soft. Bowel sounds are normal. She exhibits no distension. There is no abdominal tenderness.  Musculoskeletal:     Cervical back: Normal range of motion and neck supple.     Right knee: Swelling and bony tenderness present. No effusion or erythema. Normal range of motion. Tenderness present over the medial joint line.  Lymphadenopathy:    She has no cervical adenopathy.  Neurological: She is alert and oriented to person, place, and time.  Skin: Skin is warm and dry. No rash noted. No erythema.  Psychiatric: She has a normal mood and affect. Her behavior is normal.    Vital signs in last 24 hours: @VSRANGES @  Labs:   Estimated body mass index is 41.18 kg/m as calculated from the following:   Height as of 01/26/19: 5\' 10"  (1.778 m).   Weight as of 01/26/19: 130.2 kg.   Imaging Review Plain radiographs demonstrate severe degenerative joint disease of the right knee(s). The overall alignment issignificant varus. The bone quality appears to be good for age and reported activity level.      Assessment/Plan:  End stage arthritis, right knee   The patient history, physical examination, clinical judgment of the provider and imaging studies are consistent with end stage degenerative joint disease of the right knee(s) and total knee arthroplasty is deemed medically necessary. The treatment options including medical management, injection therapy arthroscopy and arthroplasty were discussed at length. The risks and benefits of total knee arthroplasty were presented and reviewed. The risks due to aseptic loosening, infection, stiffness, patella tracking problems, thromboembolic complications and other imponderables were discussed. The patient acknowledged the explanation, agreed to proceed with the plan and consent was signed. Patient is being admitted for  inpatient treatment for surgery, pain control, PT, OT, prophylactic antibiotics, VTE prophylaxis, progressive ambulation and ADL's and discharge planning. The patient is planning to be discharged home with home health services    Anticipated LOS equal to or greater than 2 midnights due to - Age 23 and older with one or more of the following:  - Obesity  - Expected need for hospital services (PT, OT, Nursing) required for safe  discharge  - Anticipated need for postoperative skilled nursing care or inpatient rehab  - Active co-morbidities: Diabetes and Respiratory Failure/COPD OR   - Unanticipated findings during/Post Surgery: None  - Patient is a high risk of re-admission due to: None

## 2019-08-02 NOTE — H&P (Signed)
TOTAL KNEE ADMISSION H&P  Patient is being admitted for right total knee arthroplasty.  Subjective:  Chief Complaint:right knee pain.  HPI: Kristina Washington, 59 y.o. female, has a history of pain and functional disability in the right knee due to arthritis and has failed non-surgical conservative treatments for greater than 12 weeks to includeNSAID's and/or analgesics, corticosteriod injections and activity modification.  Onset of symptoms was gradual, starting 6 years ago with gradually worsening course since that time. The patient noted no past surgery on the right knee(s).  Patient currently rates pain in the right knee(s) at 10 out of 10 with activity. Patient has night pain, worsening of pain with activity and weight bearing, pain that interferes with activities of daily living, pain with passive range of motion, crepitus and joint swelling.  Patient has evidence of periarticular osteophytes, joint subluxation and joint space narrowing by imaging studies.There is no active infection.  Patient Active Problem List   Diagnosis Date Noted  . Hypertension 01/26/2019  . Hypercholesteremia 01/26/2019  . Obesity, morbid ([place]) 03/10/2013  . Fibroid uterus 09/16/2012  . Bronchial asthma    Past Medical History:  Diagnosis Date  . Bronchial asthma   . Cataracts, bilateral   . Hypercholesterolemia   . Hypertension   . Menorrhagia     Past Surgical History:  Procedure Laterality Date  . ENDOMETRIAL ABLATION  03/23/10  . FOOT SURGERY     BOTH RIGHT AND LEFT  . KNEE ARTHROSCOPY  2002   right knee  . TUBAL LIGATION      Current Outpatient Medications  Medication Sig Dispense Refill Last Dose  . albuterol (PROVENTIL HFA;VENTOLIN HFA) 108 (90 Base) MCG/ACT inhaler Inhale 2 puffs into the lungs every 4 (four) hours as needed for wheezing. 1 Inhaler 1   . atorvastatin (LIPITOR) 40 MG tablet Take 40 mg by mouth daily.     . Cyanocobalamin (VITAMIN B 12 PO) Take by mouth.     . cyclobenzaprine  (FLEXERIL) 10 MG tablet Take 1 tablet (10 mg total) by mouth 3 (three) times daily as needed for muscle spasms. 15 tablet 0   . fluticasone furoate-vilanterol (BREO ELLIPTA) 200-25 MCG/INH AEPB Inhale 1 puff into the lungs daily. 28 each 0   . Fluticasone-Umeclidin-Vilant (TRELEGY ELLIPTA) 100-62.5-25 MCG/INH AEPB Inhale 1 puff into the lungs daily. 2 each 0   . guaiFENesin (MUCINEX) 600 MG 12 hr tablet Take 1 tablet (600 mg total) by mouth 2 (two) times daily. 6 tablet 0   . meloxicam (MOBIC) 15 MG tablet Take 15 mg by mouth daily.     . montelukast (SINGULAIR) 10 MG tablet Take 1 tablet (10 mg total) by mouth daily. 30 tablet 0   . Multiple Vitamin (MULTIVITAMIN) tablet Take 1 tablet by mouth daily.     Marland Kitchen triamterene-hydrochlorothiazide (MAXZIDE-25) 37.5-25 MG tablet Take 1 tablet by mouth daily.      No current facility-administered medications for this visit.   No Known Allergies  Social History   Tobacco Use  . Smoking status: Never Smoker  . Smokeless tobacco: Never Used  Substance Use Topics  . Alcohol use: Yes    Family History  Problem Relation Age of Onset  . Hypertension Father   . Cancer Father        COLON  . Diabetes Mother   . Hypertension Mother   . Cancer Mother   . Diabetes Sister   . Hypertension Sister   . Diabetes Paternal Aunt   . Diabetes Paternal  Uncle      Review of Systems  Cardiovascular: Positive for leg swelling.  Musculoskeletal: Positive for arthralgias and joint swelling.  Neurological: Positive for dizziness, seizures and headaches.  All other systems reviewed and are negative.   Objective:  Physical Exam  Constitutional: She is oriented to person, place, and time. She appears well-developed and well-nourished. No distress.  HENT:  Head: Normocephalic and atraumatic.  Nose: Nose normal.  Eyes: Pupils are equal, round, and reactive to light. Conjunctivae and EOM are normal.  Cardiovascular: Normal rate, regular rhythm, normal heart  sounds and intact distal pulses.  Respiratory: Effort normal and breath sounds normal. No respiratory distress. She has no wheezes.  GI: Soft. Bowel sounds are normal. She exhibits no distension. There is no abdominal tenderness.  Musculoskeletal:     Cervical back: Normal range of motion and neck supple.     Right knee: Swelling and bony tenderness present. No effusion or erythema. Normal range of motion. Tenderness present over the medial joint line.  Lymphadenopathy:    She has no cervical adenopathy.  Neurological: She is alert and oriented to person, place, and time.  Skin: Skin is warm and dry. No rash noted. No erythema.  Psychiatric: She has a normal mood and affect. Her behavior is normal.    Vital signs in last 24 hours: @VSRANGES @  Labs:   Estimated body mass index is 41.18 kg/m as calculated from the following:   Height as of 01/26/19: 5\' 10"  (1.778 m).   Weight as of 01/26/19: 130.2 kg.   Imaging Review Plain radiographs demonstrate severe degenerative joint disease of the right knee(s). The overall alignment issignificant varus. The bone quality appears to be good for age and reported activity level.      Assessment/Plan:  End stage arthritis, right knee   The patient history, physical examination, clinical judgment of the provider and imaging studies are consistent with end stage degenerative joint disease of the right knee(s) and total knee arthroplasty is deemed medically necessary. The treatment options including medical management, injection therapy arthroscopy and arthroplasty were discussed at length. The risks and benefits of total knee arthroplasty were presented and reviewed. The risks due to aseptic loosening, infection, stiffness, patella tracking problems, thromboembolic complications and other imponderables were discussed. The patient acknowledged the explanation, agreed to proceed with the plan and consent was signed. Patient is being admitted for  inpatient treatment for surgery, pain control, PT, OT, prophylactic antibiotics, VTE prophylaxis, progressive ambulation and ADL's and discharge planning. The patient is planning to be discharged home with home health services    Anticipated LOS equal to or greater than 2 midnights due to - Age 53 and older with one or more of the following:  - Obesity  - Expected need for hospital services (PT, OT, Nursing) required for safe  discharge  - Anticipated need for postoperative skilled nursing care or inpatient rehab  - Active co-morbidities: Diabetes and Respiratory Failure/COPD OR   - Unanticipated findings during/Post Surgery: None  - Patient is a high risk of re-admission due to: None

## 2019-08-10 ENCOUNTER — Other Ambulatory Visit (HOSPITAL_COMMUNITY): Payer: Self-pay | Admitting: *Deleted

## 2019-08-10 NOTE — Patient Instructions (Addendum)
DUE TO COVID-19 ONLY ONE VISITOR IS ALLOWED TO COME WITH YOU AND STAY IN THE WAITING ROOM ONLY DURING PRE OP AND PROCEDURE DAY OF SURGERY. THE 1 VISITOR MAY VISIT WITH YOU AFTER SURGERY IN YOUR PRIVATE ROOM DURING VISITING HOURS ONLY!  YOU NEED TO HAVE A COVID 19 TEST ON__ Tuesday  03/02/2021____ @_______ , THIS TEST MUST BE DONE BEFORE SURGERY, COME  Kristina Washington , [id number].  ([place]) ONCE YOUR COVID TEST IS COMPLETED, PLEASE BEGIN THE QUARANTINE INSTRUCTIONS AS OUTLINED IN YOUR HANDOUT.                Kristina Washington     Your procedure is scheduled on: Friday 08/20/2019   Report to [facility] Main  Entrance    Report to  Short Stay at   [place] AM     Call this number if you have problems the morning of surgery [phone]    Remember: Do not eat food  :After Midnight.     NO SOLID FOOD AFTER MIDNIGHT THE NIGHT PRIOR TO SURGERY. NOTHING BY MOUTH EXCEPT CLEAR LIQUIDS UNTIL  0430 am .    PLEASE FINISH gatorade  DRINK PER SURGEON ORDER  WHICH NEEDS TO BE COMPLETED AT  0430 am .   CLEAR LIQUID DIET   Foods Allowed                                                                     Foods Excluded  Coffee and tea, regular and decaf                             liquids that you cannot  Plain Jell-O any favor except red or purple                                           see through such as: Fruit ices (not with fruit pulp)                                     milk, soups, orange juice  Iced Popsicles                                    All solid food Carbonated beverages, regular and diet                                    Cranberry, grape and apple juices Sports drinks like Gatorade Lightly seasoned clear broth or consume(fat free) Sugar, honey syrup  Sample Menu Breakfast                                Lunch  Supper Cranberry juice                    Beef broth                            Chicken  broth Jell-O                                     Grape juice                           Apple juice Coffee or tea                        Jell-O                                      Popsicle                                                Coffee or tea                        Coffee or tea  _____________________________________________________________________     BRUSH YOUR TEETH MORNING OF SURGERY AND RINSE YOUR MOUTH OUT, NO CHEWING GUM CANDY OR MINTS.     Take these medicines the morning of surgery with A SIP OF WATER: Montelukast (Singulair), Atorvastatin (Lipitor), use Albuterol inhaler if needed, use Breo Ellipta inhaler, use Trelegy Ellipta inhaler and  bring inhalers with you to the hospital                                 You may not have any metal on your body including hair pins and              piercings  Do not wear jewelry, make-up, lotions, powders or perfumes, deodorant             Do not wear nail polish on your fingernails.  Do not shave  48 hours prior to surgery.              Do not bring valuables to the hospital. [place].  Contacts, dentures or bridgework may not be worn into surgery.  Leave suitcase in the car. After surgery it may be brought to your room.     Patients discharged the day of surgery will not be allowed to drive home. IF YOU ARE HAVING SURGERY AND GOING HOME THE SAME DAY, YOU MUST HAVE AN ADULT TO DRIVE YOU HOME AND  BE WITH YOU FOR 24 HOURS. YOU MAY GO HOME BY TAXI OR UBER OR ORTHERWISE, BUT AN ADULT MUST ACCOMPANY YOU HOME AND STAY WITH YOU FOR 24 HOURS.  Name and phone number of your driver:                Please read over the following fact sheets you were given: _____________________________________________________________________  [place] - Preparing for Surgery Before surgery, you can play an important role.  Because skin is not sterile, your skin needs to be as free of  germs as possible.  You can reduce the number of germs on your skin by washing with CHG (chlorahexidine gluconate) soap before surgery.  CHG is an antiseptic cleaner which kills germs and bonds with the skin to continue killing germs even after washing. Please DO NOT use if you have an allergy to CHG or antibacterial soaps.  If your skin becomes reddened/irritated stop using the CHG and inform your nurse when you arrive at Short Stay. Do not shave (including legs and underarms) for at least 48 hours prior to the first CHG shower.  You may shave your face/neck. Please follow these instructions carefully:  1.  Shower with CHG Soap the night before surgery and the  morning of Surgery.  2.  If you choose to wash your hair, wash your hair first as usual with your  normal  shampoo.  3.  After you shampoo, rinse your hair and body thoroughly to remove the  shampoo.                           4.  Use CHG as you would any other liquid soap.  You can apply chg directly  to the skin and wash                       Gently with a scrungie or clean washcloth.  5.  Apply the CHG Soap to your body ONLY FROM THE NECK DOWN.   Do not use on face/ open                           Wound or open sores. Avoid contact with eyes, ears mouth and genitals (private parts).                       Wash face,  Genitals (private parts) with your normal soap.             6.  Wash thoroughly, paying special attention to the area where your surgery  will be performed.  7.  Thoroughly rinse your body with warm water from the neck down.  8.  DO NOT shower/wash with your normal soap after using and rinsing off  the CHG Soap.                9.  Pat yourself dry with a clean towel.            10.  Wear clean pajamas.            11.  Place clean sheets on your bed the night of your first shower and do not  sleep with pets. Day of Surgery : Do not apply any lotions/deodorants the morning of surgery.  Please wear clean clothes to the  hospital/surgery center.  FAILURE TO FOLLOW THESE INSTRUCTIONS MAY RESULT IN THE CANCELLATION OF YOUR SURGERY PATIENT SIGNATURE_________________________________  NURSE SIGNATURE__________________________________  ________________________________________________________________________   [name]  An incentive spirometer is a tool that can help keep your lungs clear and active. This tool measures how well you are filling your lungs with each breath. Taking long deep breaths may help reverse or decrease the chance of developing breathing (pulmonary) problems (especially infection) following:  A long period of  time when you are unable to move or be active. BEFORE THE PROCEDURE   If the spirometer includes an indicator to show your best effort, your nurse or respiratory therapist will set it to a desired goal.  If possible, sit up straight or lean slightly forward. Try not to slouch.  Hold the incentive spirometer in an upright position. INSTRUCTIONS FOR USE  1. Sit on the edge of your bed if possible, or sit up as far as you can in bed or on a chair. 2. Hold the incentive spirometer in an upright position. 3. Breathe out normally. 4. Place the mouthpiece in your mouth and seal your lips tightly around it. 5. Breathe in slowly and as deeply as possible, raising the piston or the ball toward the top of the column. 6. Hold your breath for 3-5 seconds or for as long as possible. Allow the piston or ball to fall to the bottom of the column. 7. Remove the mouthpiece from your mouth and breathe out normally. 8. Rest for a few seconds and repeat Steps 1 through 7 at least 10 times every 1-2 hours when you are awake. Take your time and take a few normal breaths between deep breaths. 9. The spirometer may include an indicator to show your best effort. Use the indicator as a goal to work toward during each repetition. 10. After each set of 10 deep breaths, practice coughing to be sure  your lungs are clear. If you have an incision (the cut made at the time of surgery), support your incision when coughing by placing a pillow or rolled up towels firmly against it. Once you are able to get out of bed, walk around indoors and cough well. You may stop using the incentive spirometer when instructed by your caregiver.  RISKS AND COMPLICATIONS  Take your time so you do not get dizzy or light-headed.  If you are in pain, you may need to take or ask for pain medication before doing incentive spirometry. It is harder to take a deep breath if you are having pain. AFTER USE  Rest and breathe slowly and easily.  It can be helpful to keep track of a log of your progress. Your caregiver can provide you with a simple table to help with this. If you are using the spirometer at home, follow these instructions: [place] IF:   You are having difficultly using the spirometer.  You have trouble using the spirometer as often as instructed.  Your pain medication is not giving enough relief while using the spirometer.  You develop fever of 100.5 F (38.1 C) or higher. SEEK IMMEDIATE MEDICAL CARE IF:   You cough up bloody sputum that had not been present before.  You develop fever of 102 F (38.9 C) or greater.  You develop worsening pain at or near the incision site. MAKE SURE YOU:   Understand these instructions.  Will watch your condition.  Will get help right away if you are not doing well or get worse. Document Released: 10/14/2006 Document Revised: 08/26/2011 Document Reviewed: 12/15/2006 ExitCare Patient Information 2014 ExitCare, [place].   ________________________________________________________________________  WHAT IS A BLOOD TRANSFUSION? Blood Transfusion Information  A transfusion is the replacement of blood or some of its parts. Blood is made up of multiple cells which provide different functions.  Red blood cells carry oxygen and are used for blood loss  replacement.  White blood cells fight against infection.  Platelets control bleeding.  Plasma helps clot blood.  Other blood products are available for specialized needs, such as hemophilia or other clotting disorders. BEFORE THE TRANSFUSION  Who gives blood for transfusions?   Healthy volunteers who are fully evaluated to make sure their blood is safe. This is blood bank blood. Transfusion therapy is the safest it has ever been in the practice of medicine. Before blood is taken from a donor, a complete history is taken to make sure that person has no history of diseases nor engages in risky social behavior (examples are intravenous drug use or sexual activity with multiple partners). The donor's travel history is screened to minimize risk of transmitting infections, such as malaria. The donated blood is tested for signs of infectious diseases, such as HIV and hepatitis. The blood is then tested to be sure it is compatible with you in order to minimize the chance of a transfusion reaction. If you or a relative donates blood, this is often done in anticipation of surgery and is not appropriate for emergency situations. It takes many days to process the donated blood. RISKS AND COMPLICATIONS Although transfusion therapy is very safe and saves many lives, the main dangers of transfusion include:   Getting an infectious disease.  Developing a transfusion reaction. This is an allergic reaction to something in the blood you were given. Every precaution is taken to prevent this. The decision to have a blood transfusion has been considered carefully by your caregiver before blood is given. Blood is not given unless the benefits outweigh the risks. AFTER THE TRANSFUSION  Right after receiving a blood transfusion, you will usually feel much better and more energetic. This is especially true if your red blood cells have gotten low (anemic). The transfusion raises the level of the red blood cells which  carry oxygen, and this usually causes an energy increase.  The nurse administering the transfusion will monitor you carefully for complications. HOME CARE INSTRUCTIONS  No special instructions are needed after a transfusion. You may find your energy is better. Speak with your caregiver about any limitations on activity for underlying diseases you may have. SEEK MEDICAL CARE IF:   Your condition is not improving after your transfusion.  You develop redness or irritation at the intravenous (IV) site. SEEK IMMEDIATE MEDICAL CARE IF:  Any of the following symptoms occur over the next 12 hours:  Shaking chills.  You have a temperature by mouth above 102 F (38.9 C), not controlled by medicine.  Chest, back, or muscle pain.  People around you feel you are not acting correctly or are confused.  Shortness of breath or difficulty breathing.  Dizziness and fainting.  You get a rash or develop hives.  You have a decrease in urine output.  Your urine turns a dark color or changes to pink, red, or brown. Any of the following symptoms occur over the next 10 days:  You have a temperature by mouth above 102 F (38.9 C), not controlled by medicine.  Shortness of breath.  Weakness after normal activity.  The white part of the eye turns yellow (jaundice).  You have a decrease in the amount of urine or are urinating less often.  Your urine turns a dark color or changes to pink, red, or brown. Document Released: 05/31/2000 Document Revised: 08/26/2011 Document Reviewed: 01/18/2008 [facility] Patient Information 2014 [place], [place].  _______________________________________________________________________

## 2019-08-13 ENCOUNTER — Encounter (HOSPITAL_COMMUNITY)
Admission: RE | Admit: 2019-08-13 | Discharge: 2019-08-13 | Disposition: A | Discharge status: Home / Self Care | Payer: 59 | Source: Ambulatory Visit | Attending: Orthopedic Surgery | Admitting: Orthopedic Surgery

## 2019-08-13 ENCOUNTER — Encounter (HOSPITAL_COMMUNITY): Payer: Self-pay

## 2019-08-13 ENCOUNTER — Other Ambulatory Visit: Payer: Self-pay

## 2019-08-13 DIAGNOSIS — Z01812 Encounter for preprocedural laboratory examination: Secondary | ICD-10-CM | POA: Diagnosis not present

## 2019-08-13 HISTORY — DX: Cardiac murmur, unspecified: R01.1

## 2019-08-13 HISTORY — DX: Unspecified osteoarthritis, unspecified site: M19.90

## 2019-08-13 HISTORY — DX: Other complications of anesthesia, initial encounter: T88.59XA

## 2019-08-13 HISTORY — DX: Nonrheumatic mitral (valve) prolapse: I34.1

## 2019-08-13 HISTORY — DX: Bronchitis, not specified as acute or chronic: J40

## 2019-08-13 HISTORY — DX: Prediabetes: R73.03

## 2019-08-13 HISTORY — DX: Headache, unspecified: R51.9

## 2019-08-13 LAB — CBC WITH DIFFERENTIAL/PLATELET
Abs Immature Granulocytes: 0.01 K/uL (ref 0.00–0.07)
Basophils Absolute: 0 K/uL (ref 0.0–0.1)
Basophils Relative: 0 %
Eosinophils Absolute: 0.1 K/uL (ref 0.0–0.5)
Eosinophils Relative: 3 %
HCT: 39.6 % (ref 36.0–46.0)
Hemoglobin: 12.2 g/dL (ref 12.0–15.0)
Immature Granulocytes: 0 %
Lymphocytes Relative: 46 %
Lymphs Abs: 1.9 K/uL (ref 0.7–4.0)
MCH: 27.2 pg (ref 26.0–34.0)
MCHC: 30.8 g/dL (ref 30.0–36.0)
MCV: 88.2 fL (ref 80.0–100.0)
Monocytes Absolute: 0.3 K/uL (ref 0.1–1.0)
Monocytes Relative: 6 %
Neutro Abs: 1.9 K/uL (ref 1.7–7.7)
Neutrophils Relative %: 45 %
Platelets: 325 K/uL (ref 150–400)
RBC: 4.49 MIL/uL (ref 3.87–5.11)
RDW: 14.4 % (ref 11.5–15.5)
WBC: 4.2 K/uL (ref 4.0–10.5)
nRBC: 0 % (ref 0.0–0.2)

## 2019-08-13 LAB — URINALYSIS, ROUTINE W REFLEX MICROSCOPIC
Bacteria, UA: NONE SEEN
Bilirubin Urine: NEGATIVE
Glucose, UA: NEGATIVE mg/dL
Ketones, ur: NEGATIVE mg/dL
Nitrite: NEGATIVE
Protein, ur: NEGATIVE mg/dL
Specific Gravity, Urine: 1.025 (ref 1.005–1.030)
pH: 6 (ref 5.0–8.0)

## 2019-08-13 LAB — COMPREHENSIVE METABOLIC PANEL WITH GFR
ALT: 21 U/L (ref 0–44)
AST: 19 U/L (ref 15–41)
Albumin: 4.1 g/dL (ref 3.5–5.0)
Alkaline Phosphatase: 84 U/L (ref 38–126)
Anion gap: 7 (ref 5–15)
BUN: 13 mg/dL (ref 6–20)
CO2: 27 mmol/L (ref 22–32)
Calcium: 8.9 mg/dL (ref 8.9–10.3)
Chloride: 107 mmol/L (ref 98–111)
Creatinine, Ser: 0.65 mg/dL (ref 0.44–1.00)
GFR calc Af Amer: >60 mL/min (ref >60)
GFR calc non Af Amer: >60 mL/min (ref >60)
Glucose, Bld: 99 mg/dL (ref 70–99)
Potassium: 3.5 mmol/L (ref 3.5–5.1)
Sodium: 141 mmol/L (ref 135–145)
Total Bilirubin: 0.6 mg/dL (ref 0.3–1.2)
Total Protein: 7.5 g/dL (ref 6.5–8.1)

## 2019-08-13 LAB — ABO/RH: ABO/RH(D): O POS

## 2019-08-13 LAB — PROTIME-INR
INR: 1 (ref 0.8–1.2)
Prothrombin Time: 13 seconds (ref 11.4–15.2)

## 2019-08-13 LAB — APTT: aPTT: 30 s (ref 24–36)

## 2019-08-13 LAB — HEMOGLOBIN A1C
Hgb A1c MFr Bld: 6.1 % — ABNORMAL HIGH (ref 4.8–5.6)
Mean Plasma Glucose: 128.37 mg/dL

## 2019-08-13 LAB — SURGICAL PCR SCREEN
MRSA, PCR: NEGATIVE
Staphylococcus aureus: NEGATIVE

## 2019-08-13 NOTE — Progress Notes (Addendum)
PCP - Dr. Jani Gravel Cardiologist -   Chest x-ray -  EKG -  Stress Test -  ECHO -  Cardiac Cath -   Sleep Study -  CPAP -   Fasting Blood Sugar -  Checks Blood Sugar _____ times a day  Blood Thinner Instructions: Aspirin Instructions: Last Dose:  Anesthesia review:   Patient denies shortness of breath, fever, cough and chest pain at PAT appointment   Denies    Patient verbalized understanding of instructions that were given to them at the PAT appointment. Patient was also instructed that they will need to review over the PAT instructions again at home before surgery.

## 2019-08-13 NOTE — Progress Notes (Addendum)
PCP - Jani Gravel  Cardiologist - none  Chest x-ray -  EKG - 03-04-19 requested Stress Test -  ECHO -  Cardiac Cath -   Sleep Study -  CPAP -   Fasting Blood Sugar -  Checks Blood Sugar _____ times a day  Blood Thinner Instructions: Aspirin Instructions: Last Dose:  Anesthesia review: MVP mild per pt. Managed by pcp  no cardiologist  Patient denies shortness of breath, fever, cough and chest pain at PAT appointment  denies   Patient verbalized understanding of instructions that were given to them at the PAT appointment. Patient was also instructed that they will need to review over the PAT instructions again at home before surgery.

## 2019-08-14 LAB — URINE CULTURE

## 2019-08-17 ENCOUNTER — Other Ambulatory Visit (HOSPITAL_COMMUNITY)
Admission: RE | Admit: 2019-08-17 | Discharge: 2019-08-17 | Disposition: A | Discharge status: Home / Self Care | Payer: 59 | Source: Ambulatory Visit | Attending: Orthopedic Surgery | Admitting: Orthopedic Surgery

## 2019-08-17 DIAGNOSIS — Z01812 Encounter for preprocedural laboratory examination: Secondary | ICD-10-CM | POA: Diagnosis not present

## 2019-08-17 DIAGNOSIS — Z20822 Contact with and (suspected) exposure to covid-19: Secondary | ICD-10-CM | POA: Diagnosis not present

## 2019-08-17 LAB — SARS CORONAVIRUS 2 (TAT 6-24 HRS): SARS Coronavirus 2: NEGATIVE

## 2019-08-17 NOTE — Care Plan (Signed)
Ortho Bundle Case Management Note  Patient Details  Name: Kristina Washington MRN: [id number] Date of Birth: 10/19/1960   Patient will discharge to home with family. Possibly same day if able. Rolling walker and CPM ordered from [place]. HHPT referral to [facility] and OPPT set up at [facility] office. Patient and MD in agreement with plan. Choice offered                   DME Arranged:  Walker rolling, CPM DME Agency:  [employer]  HH Arranged:  PT [place] Agency:  [facility] (formerly [facility])  Additional Comments: Please contact me with any questions of if this plan should need to change.  Ladell Heads,  [place] [facility]opaedic Specialist  [phone] 08/17/2019, 10:39 AM

## 2019-08-19 MED ORDER — DEXTROSE 5 % IV SOLN
3.0000 g | INTRAVENOUS | Status: AC
  Administered 2019-08-20: 08:00:00 3 g via INTRAVENOUS
  Filled 2019-08-19: qty 3

## 2019-08-19 MED ORDER — TRANEXAMIC ACID 1000 MG/10ML IV SOLN
2000.0000 mg | INTRAVENOUS | Status: DC
  Filled 2019-08-19: qty 20

## 2019-08-19 MED ORDER — BUPIVACAINE LIPOSOME 1.3 % IJ SUSP
20.0000 mL | Freq: Once | INTRAMUSCULAR | Status: DC
  Filled 2019-08-19: qty 20

## 2019-08-19 NOTE — Anesthesia Preprocedure Evaluation (Addendum)
Anesthesia Evaluation  Patient identified by MRN, date of birth, ID band Patient awake    Reviewed: Allergy & Precautions, H&P , NPO status , Patient's Chart, lab work & pertinent test results  Airway Mallampati: III  TM Distance: >3 FB Neck ROM: Full    Dental no notable dental hx. (+) Teeth Intact, Dental Advisory Given   Pulmonary neg pulmonary ROS,    Pulmonary exam normal breath sounds clear to auscultation       Cardiovascular Exercise Tolerance: Good hypertension, Pt. on medications  Rhythm:Regular Rate:Normal     Neuro/Psych negative neurological ROS  negative psych ROS   GI/Hepatic negative GI ROS, Neg liver ROS,   Endo/Other  diabetes, Type 2, Oral Hypoglycemic AgentsMorbid obesity  Renal/GU negative Renal ROS  negative genitourinary   Musculoskeletal   Abdominal   Peds  Hematology negative hematology ROS (+)   Anesthesia Other Findings   Reproductive/Obstetrics negative OB ROS                            Anesthesia Physical Anesthesia Plan  ASA: III  Anesthesia Plan: MAC and Spinal   Post-op Pain Management:  Regional for Post-op pain   Induction: Intravenous  PONV Risk Score and Plan: 3 and Ondansetron, Dexamethasone, Propofol infusion and Midazolam  Airway Management Planned: Simple Face Mask  Additional Equipment:   Intra-op Plan:   Post-operative Plan:   Informed Consent: I have reviewed the patients History and Physical, chart, labs and discussed the procedure including the risks, benefits and alternatives for the proposed anesthesia with the patient or authorized representative who has indicated his/her understanding and acceptance.     Dental advisory given  Plan Discussed with: CRNA  Anesthesia Plan Comments:         Anesthesia Quick Evaluation

## 2019-08-20 ENCOUNTER — Other Ambulatory Visit: Payer: Self-pay

## 2019-08-20 ENCOUNTER — Ambulatory Visit (HOSPITAL_COMMUNITY): Payer: 59 | Admitting: Anesthesiology

## 2019-08-20 ENCOUNTER — Encounter (HOSPITAL_COMMUNITY): Payer: Self-pay | Admitting: Orthopedic Surgery

## 2019-08-20 ENCOUNTER — Encounter (HOSPITAL_COMMUNITY)
Admission: RE | Disposition: A | Discharge status: Home / Self Care | Payer: Self-pay | Source: Home / Self Care | Attending: Orthopedic Surgery

## 2019-08-20 ENCOUNTER — Ambulatory Visit (HOSPITAL_COMMUNITY)
Admission: RE | Admit: 2019-08-20 | Discharge: 2019-08-21 | Disposition: A | Discharge status: Home / Self Care | Payer: 59 | Attending: Orthopedic Surgery | Admitting: Orthopedic Surgery

## 2019-08-20 ENCOUNTER — Ambulatory Visit (HOSPITAL_COMMUNITY): Payer: 59 | Admitting: Physician Assistant

## 2019-08-20 DIAGNOSIS — E7800 Pure hypercholesterolemia, unspecified: Secondary | ICD-10-CM | POA: Diagnosis not present

## 2019-08-20 DIAGNOSIS — E119 Type 2 diabetes mellitus without complications: Secondary | ICD-10-CM | POA: Insufficient documentation

## 2019-08-20 DIAGNOSIS — Z7984 Long term (current) use of oral hypoglycemic drugs: Secondary | ICD-10-CM | POA: Diagnosis not present

## 2019-08-20 DIAGNOSIS — M21161 Varus deformity, not elsewhere classified, right knee: Secondary | ICD-10-CM | POA: Diagnosis not present

## 2019-08-20 DIAGNOSIS — Z7951 Long term (current) use of inhaled steroids: Secondary | ICD-10-CM | POA: Diagnosis not present

## 2019-08-20 DIAGNOSIS — Z79899 Other long term (current) drug therapy: Secondary | ICD-10-CM | POA: Insufficient documentation

## 2019-08-20 DIAGNOSIS — M21861 Other specified acquired deformities of right lower leg: Secondary | ICD-10-CM | POA: Insufficient documentation

## 2019-08-20 DIAGNOSIS — Z791 Long term (current) use of non-steroidal anti-inflammatories (NSAID): Secondary | ICD-10-CM | POA: Diagnosis not present

## 2019-08-20 DIAGNOSIS — I10 Essential (primary) hypertension: Secondary | ICD-10-CM | POA: Diagnosis not present

## 2019-08-20 DIAGNOSIS — M1711 Unilateral primary osteoarthritis, right knee: Secondary | ICD-10-CM | POA: Diagnosis present

## 2019-08-20 DIAGNOSIS — Z6838 Body mass index (BMI) 38.0-38.9, adult: Secondary | ICD-10-CM | POA: Diagnosis not present

## 2019-08-20 DIAGNOSIS — J45909 Unspecified asthma, uncomplicated: Secondary | ICD-10-CM | POA: Insufficient documentation

## 2019-08-20 HISTORY — PX: TOTAL KNEE ARTHROPLASTY: SHX125

## 2019-08-20 LAB — TYPE AND SCREEN
ABO/RH(D): O POS
Antibody Screen: NEGATIVE

## 2019-08-20 SURGERY — ARTHROPLASTY, KNEE, TOTAL
Anesthesia: Monitor Anesthesia Care | Site: Knee | Laterality: Right

## 2019-08-20 MED ORDER — BUPIVACAINE LIPOSOME 1.3 % IJ SUSP
INTRAMUSCULAR | Status: DC | PRN
  Administered 2019-08-20: 20 mL

## 2019-08-20 MED ORDER — POVIDONE-IODINE 10 % EX SWAB
2.0000 "application " | Freq: Once | CUTANEOUS | Status: AC
  Administered 2019-08-20: 2 via TOPICAL

## 2019-08-20 MED ORDER — LOSARTAN POTASSIUM 50 MG PO TABS
100.0000 mg | ORAL_TABLET | Freq: Every day | ORAL | Status: DC
  Administered 2019-08-20 – 2019-08-21 (×2): 100 mg via ORAL
  Filled 2019-08-20 (×2): qty 2

## 2019-08-20 MED ORDER — SODIUM CHLORIDE 0.9 % IV SOLN: INTRAVENOUS | Status: DC

## 2019-08-20 MED ORDER — ASPIRIN EC 81 MG PO TBEC: 81.0000 mg | DELAYED_RELEASE_TABLET | Freq: Two times a day (BID) | ORAL | 0 refills | Status: AC

## 2019-08-20 MED ORDER — SODIUM CHLORIDE (PF) 0.9 % IJ SOLN
INTRAMUSCULAR | Status: AC
  Filled 2019-08-20: qty 50

## 2019-08-20 MED ORDER — SODIUM CHLORIDE 0.9% FLUSH
INTRAVENOUS | Status: DC | PRN
  Administered 2019-08-20: 50 mL

## 2019-08-20 MED ORDER — CHLORHEXIDINE GLUCONATE 4 % EX LIQD: 60.0000 mL | Freq: Once | CUTANEOUS | Status: DC

## 2019-08-20 MED ORDER — MONTELUKAST SODIUM 10 MG PO TABS
10.0000 mg | ORAL_TABLET | Freq: Every day | ORAL | Status: DC
  Administered 2019-08-20: 10 mg via ORAL
  Filled 2019-08-20: qty 1

## 2019-08-20 MED ORDER — MENTHOL 3 MG MT LOZG: 1.0000 | LOZENGE | OROMUCOSAL | Status: DC | PRN

## 2019-08-20 MED ORDER — CELECOXIB 200 MG PO CAPS
200.0000 mg | ORAL_CAPSULE | Freq: Two times a day (BID) | ORAL | Status: DC
  Administered 2019-08-20 – 2019-08-21 (×2): 200 mg via ORAL
  Filled 2019-08-20 (×2): qty 1

## 2019-08-20 MED ORDER — METOCLOPRAMIDE HCL 5 MG PO TABS: 5.0000 mg | ORAL_TABLET | Freq: Three times a day (TID) | ORAL | Status: DC | PRN

## 2019-08-20 MED ORDER — HYDROMORPHONE HCL 1 MG/ML IJ SOLN
0.2500 mg | INTRAMUSCULAR | Status: DC | PRN
  Administered 2019-08-20 (×2): 0.5 mg via INTRAVENOUS

## 2019-08-20 MED ORDER — ACETAMINOPHEN 325 MG PO TABS: 650.0000 mg | ORAL_TABLET | ORAL | 2 refills | Status: AC | PRN

## 2019-08-20 MED ORDER — ACETAMINOPHEN 325 MG PO TABS: 325.0000 mg | ORAL_TABLET | Freq: Four times a day (QID) | ORAL | Status: DC | PRN

## 2019-08-20 MED ORDER — TRANEXAMIC ACID-NACL 1000-0.7 MG/100ML-% IV SOLN
1000.0000 mg | INTRAVENOUS | Status: AC
  Administered 2019-08-20: 08:00:00 1000 mg via INTRAVENOUS
  Filled 2019-08-20: qty 100

## 2019-08-20 MED ORDER — LACTATED RINGERS IV SOLN: INTRAVENOUS | Status: DC

## 2019-08-20 MED ORDER — ACETAMINOPHEN 500 MG PO TABS
1000.0000 mg | ORAL_TABLET | Freq: Once | ORAL | Status: AC
  Administered 2019-08-20: 1000 mg via ORAL
  Filled 2019-08-20: qty 2

## 2019-08-20 MED ORDER — DOCUSATE SODIUM 100 MG PO CAPS
100.0000 mg | ORAL_CAPSULE | Freq: Two times a day (BID) | ORAL | Status: DC
  Administered 2019-08-20 – 2019-08-21 (×2): 100 mg via ORAL
  Filled 2019-08-20 (×2): qty 1

## 2019-08-20 MED ORDER — GLYCOPYRROLATE PF 0.2 MG/ML IJ SOSY
PREFILLED_SYRINGE | INTRAMUSCULAR | Status: AC
  Filled 2019-08-20: qty 1

## 2019-08-20 MED ORDER — HYDROMORPHONE HCL 1 MG/ML IJ SOLN
INTRAMUSCULAR | Status: AC
  Filled 2019-08-20: qty 2

## 2019-08-20 MED ORDER — ONDANSETRON HCL 4 MG PO TABS: 4.0000 mg | ORAL_TABLET | Freq: Four times a day (QID) | ORAL | Status: DC | PRN

## 2019-08-20 MED ORDER — PHENOL 1.4 % MT LIQD: 1.0000 | OROMUCOSAL | Status: DC | PRN

## 2019-08-20 MED ORDER — ALBUTEROL SULFATE HFA 108 (90 BASE) MCG/ACT IN AERS: 2.0000 | INHALATION_SPRAY | RESPIRATORY_TRACT | Status: DC | PRN

## 2019-08-20 MED ORDER — 0.9 % SODIUM CHLORIDE (POUR BTL) OPTIME
TOPICAL | Status: DC | PRN
  Administered 2019-08-20: 1000 mL

## 2019-08-20 MED ORDER — GABAPENTIN 300 MG PO CAPS: ORAL_CAPSULE | ORAL | 0 refills | Status: DC

## 2019-08-20 MED ORDER — PROPOFOL 500 MG/50ML IV EMUL
INTRAVENOUS | Status: AC
  Filled 2019-08-20: qty 50

## 2019-08-20 MED ORDER — BUPIVACAINE-EPINEPHRINE (PF) 0.5% -1:200000 IJ SOLN
INTRAMUSCULAR | Status: DC | PRN
  Administered 2019-08-20: 20 mL via PERINEURAL

## 2019-08-20 MED ORDER — METOCLOPRAMIDE HCL 5 MG/ML IJ SOLN
5.0000 mg | Freq: Three times a day (TID) | INTRAMUSCULAR | Status: DC | PRN
  Administered 2019-08-20: 10 mg via INTRAVENOUS
  Filled 2019-08-20: qty 2

## 2019-08-20 MED ORDER — OXYCODONE HCL 5 MG PO TABS
5.0000 mg | ORAL_TABLET | ORAL | Status: DC | PRN
  Administered 2019-08-20 (×2): 10 mg via ORAL
  Administered 2019-08-20: 5 mg via ORAL
  Administered 2019-08-21: 09:00:00 10 mg via ORAL
  Filled 2019-08-20 (×3): qty 2

## 2019-08-20 MED ORDER — MIDAZOLAM HCL 2 MG/2ML IJ SOLN
INTRAMUSCULAR | Status: AC
  Filled 2019-08-20: qty 2

## 2019-08-20 MED ORDER — ASPIRIN 81 MG PO CHEW
81.0000 mg | CHEWABLE_TABLET | Freq: Two times a day (BID) | ORAL | Status: DC
  Administered 2019-08-20 – 2019-08-21 (×2): 81 mg via ORAL
  Filled 2019-08-20 (×2): qty 1

## 2019-08-20 MED ORDER — PROPOFOL 500 MG/50ML IV EMUL
INTRAVENOUS | Status: DC | PRN
  Administered 2019-08-20: 150 ug/kg/min via INTRAVENOUS

## 2019-08-20 MED ORDER — SORBITOL 70 % SOLN
30.0000 mL | Freq: Every day | Status: DC | PRN
  Filled 2019-08-20: qty 30

## 2019-08-20 MED ORDER — CELECOXIB 200 MG PO CAPS: 200.0000 mg | ORAL_CAPSULE | Freq: Two times a day (BID) | ORAL | 0 refills | Status: AC

## 2019-08-20 MED ORDER — SENNOSIDES-DOCUSATE SODIUM 8.6-50 MG PO TABS: 1.0000 | ORAL_TABLET | Freq: Every evening | ORAL | Status: DC | PRN

## 2019-08-20 MED ORDER — ACETAMINOPHEN 500 MG PO TABS
1000.0000 mg | ORAL_TABLET | Freq: Four times a day (QID) | ORAL | Status: AC
  Administered 2019-08-20 – 2019-08-21 (×4): 1000 mg via ORAL
  Filled 2019-08-20 (×4): qty 2

## 2019-08-20 MED ORDER — CYCLOBENZAPRINE HCL 10 MG PO TABS
10.0000 mg | ORAL_TABLET | Freq: Three times a day (TID) | ORAL | Status: DC | PRN
  Administered 2019-08-20: 10 mg via ORAL
  Filled 2019-08-20: qty 1

## 2019-08-20 MED ORDER — OXYCODONE HCL 5 MG PO TABS
ORAL_TABLET | ORAL | Status: AC
  Filled 2019-08-20: qty 1

## 2019-08-20 MED ORDER — FENTANYL CITRATE (PF) 100 MCG/2ML IJ SOLN
INTRAMUSCULAR | Status: DC | PRN
  Administered 2019-08-20 (×2): 50 ug via INTRAVENOUS

## 2019-08-20 MED ORDER — BUPIVACAINE IN DEXTROSE 0.75-8.25 % IT SOLN
INTRATHECAL | Status: DC | PRN
  Administered 2019-08-20: 1.8 mL via INTRATHECAL

## 2019-08-20 MED ORDER — MIDAZOLAM HCL 5 MG/5ML IJ SOLN
INTRAMUSCULAR | Status: DC | PRN
  Administered 2019-08-20: 2 mg via INTRAVENOUS

## 2019-08-20 MED ORDER — HYDRALAZINE HCL 20 MG/ML IJ SOLN
INTRAMUSCULAR | Status: AC
  Administered 2019-08-20: 5 mg
  Filled 2019-08-20: qty 1

## 2019-08-20 MED ORDER — HYDROCHLOROTHIAZIDE 25 MG PO TABS
25.0000 mg | ORAL_TABLET | Freq: Every day | ORAL | Status: DC
  Administered 2019-08-20 – 2019-08-21 (×2): 25 mg via ORAL
  Filled 2019-08-20 (×2): qty 1

## 2019-08-20 MED ORDER — DEXAMETHASONE SODIUM PHOSPHATE 10 MG/ML IJ SOLN
INTRAMUSCULAR | Status: AC
  Filled 2019-08-20: qty 1

## 2019-08-20 MED ORDER — FENTANYL CITRATE (PF) 100 MCG/2ML IJ SOLN
INTRAMUSCULAR | Status: AC
  Filled 2019-08-20: qty 2

## 2019-08-20 MED ORDER — LACTATED RINGERS IV BOLUS
500.0000 mL | Freq: Once | INTRAVENOUS | Status: AC
  Administered 2019-08-20: 500 mL via INTRAVENOUS

## 2019-08-20 MED ORDER — BUPIVACAINE-EPINEPHRINE (PF) 0.25% -1:200000 IJ SOLN
INTRAMUSCULAR | Status: DC | PRN
  Administered 2019-08-20: 30 mL

## 2019-08-20 MED ORDER — GABAPENTIN 300 MG PO CAPS
300.0000 mg | ORAL_CAPSULE | Freq: Three times a day (TID) | ORAL | Status: DC
  Administered 2019-08-20 – 2019-08-21 (×2): 300 mg via ORAL
  Filled 2019-08-20 (×2): qty 1

## 2019-08-20 MED ORDER — PROPOFOL 10 MG/ML IV BOLUS
INTRAVENOUS | Status: AC
  Filled 2019-08-20: qty 60

## 2019-08-20 MED ORDER — OXYCODONE HCL 5 MG PO TABS: ORAL_TABLET | ORAL | 0 refills | Status: DC

## 2019-08-20 MED ORDER — HYDROMORPHONE HCL 1 MG/ML IJ SOLN: 0.5000 mg | INTRAMUSCULAR | Status: DC | PRN

## 2019-08-20 MED ORDER — TRANEXAMIC ACID 1000 MG/10ML IV SOLN
INTRAVENOUS | Status: DC | PRN
  Administered 2019-08-20: 2000 mg via TOPICAL

## 2019-08-20 MED ORDER — BUPIVACAINE-EPINEPHRINE (PF) 0.25% -1:200000 IJ SOLN
INTRAMUSCULAR | Status: AC
  Filled 2019-08-20: qty 30

## 2019-08-20 MED ORDER — GLYCOPYRROLATE 0.2 MG/ML IJ SOLN
INTRAMUSCULAR | Status: DC | PRN
  Administered 2019-08-20: .1 mg via INTRAVENOUS

## 2019-08-20 MED ORDER — ONDANSETRON HCL 4 MG/2ML IJ SOLN
INTRAMUSCULAR | Status: AC
  Filled 2019-08-20: qty 2

## 2019-08-20 MED ORDER — ALBUTEROL SULFATE (2.5 MG/3ML) 0.083% IN NEBU: 2.5000 mg | INHALATION_SOLUTION | Freq: Four times a day (QID) | RESPIRATORY_TRACT | Status: DC | PRN

## 2019-08-20 MED ORDER — SIMVASTATIN 20 MG PO TABS
10.0000 mg | ORAL_TABLET | Freq: Every day | ORAL | Status: DC
  Administered 2019-08-20: 22:00:00 10 mg via ORAL
  Filled 2019-08-20: qty 1

## 2019-08-20 MED ORDER — FLEET ENEMA 7-19 GM/118ML RE ENEM: 1.0000 | ENEMA | Freq: Once | RECTAL | Status: DC | PRN

## 2019-08-20 MED ORDER — TRANEXAMIC ACID-NACL 1000-0.7 MG/100ML-% IV SOLN
1000.0000 mg | Freq: Once | INTRAVENOUS | Status: AC
  Administered 2019-08-20: 1000 mg via INTRAVENOUS
  Filled 2019-08-20: qty 100

## 2019-08-20 MED ORDER — LACTATED RINGERS IV BOLUS
250.0000 mL | Freq: Once | INTRAVENOUS | Status: AC
  Administered 2019-08-20: 250 mL via INTRAVENOUS

## 2019-08-20 MED ORDER — CEFAZOLIN SODIUM-DEXTROSE 2-4 GM/100ML-% IV SOLN
2.0000 g | Freq: Four times a day (QID) | INTRAVENOUS | Status: AC
  Administered 2019-08-20 (×2): 2 g via INTRAVENOUS
  Filled 2019-08-20 (×2): qty 100

## 2019-08-20 MED ORDER — ONDANSETRON HCL 4 MG/2ML IJ SOLN
4.0000 mg | Freq: Four times a day (QID) | INTRAMUSCULAR | Status: DC | PRN
  Administered 2019-08-20 (×2): 4 mg via INTRAVENOUS
  Filled 2019-08-20: qty 2

## 2019-08-20 MED ORDER — OXYCODONE HCL 5 MG PO TABS
ORAL_TABLET | ORAL | Status: AC
  Administered 2019-08-20: 5 mg
  Filled 2019-08-20: qty 1

## 2019-08-20 MED ORDER — SODIUM CHLORIDE 0.9 % IR SOLN
Status: DC | PRN
  Administered 2019-08-20: 1000 mL

## 2019-08-20 SURGICAL SUPPLY — 59 items
ATTUNE MED DOME PAT 38 KNEE (Knees) ×2 IMPLANT
ATTUNE PS FEM RT SZ 5 CEM KNEE (Femur) ×2 IMPLANT
ATTUNE PSRP INSR SZ5 8 KNEE (Insert) ×2 IMPLANT
BAG DECANTER FOR FLEXI CONT (MISCELLANEOUS) ×2 IMPLANT
BAG ZIPLOCK 12X15 (MISCELLANEOUS) ×2 IMPLANT
BASE TIBIA ATTUNE KNEE SYS SZ6 (Knees) ×1 IMPLANT
BENZOIN TINCTURE PRP APPL 2/3 (GAUZE/BANDAGES/DRESSINGS) ×2 IMPLANT
BLADE SAGITTAL 25.0X1.19X90 (BLADE) ×2 IMPLANT
BLADE SAW SGTL 13X75X1.27 (BLADE) ×2 IMPLANT
BLADE SURG 15 STRL LF DISP TIS (BLADE) ×2 IMPLANT
BLADE SURG 15 STRL SS (BLADE) ×4
BLADE SURG SZ10 CARB STEEL (BLADE) ×2 IMPLANT
BNDG ELASTIC 6X15 VLCR STRL LF (GAUZE/BANDAGES/DRESSINGS) ×2 IMPLANT
BOWL SMART MIX CTS (DISPOSABLE) ×2 IMPLANT
CEMENT HV SMART SET (Cement) ×4 IMPLANT
CLSR STERI-STRIP ANTIMIC 1/2X4 (GAUZE/BANDAGES/DRESSINGS) ×4 IMPLANT
COVER SURGICAL LIGHT HANDLE (MISCELLANEOUS) ×2 IMPLANT
COVER WAND RF STERILE (DRAPES) ×2 IMPLANT
CUFF TOURN SGL QUICK 34 (TOURNIQUET CUFF) ×1
CUFF TRNQT CYL 34X4.125X (TOURNIQUET CUFF) ×1 IMPLANT
DECANTER SPIKE VIAL GLASS SM (MISCELLANEOUS) ×4 IMPLANT
DRAPE INCISE IOBAN 66X45 STRL (DRAPES) IMPLANT
DRAPE U-SHAPE 47X51 STRL (DRAPES) ×2 IMPLANT
DRESSING AQUACEL AG SP 3.5X10 (GAUZE/BANDAGES/DRESSINGS) ×1 IMPLANT
DRSG AQUACEL AG SP 3.5X10 (GAUZE/BANDAGES/DRESSINGS) ×2
DURAPREP 26ML APPLICATOR (WOUND CARE) ×4 IMPLANT
ELECT REM PT RETURN 15FT ADLT (MISCELLANEOUS) ×2 IMPLANT
GLOVE BIOGEL PI IND STRL 8 (GLOVE) ×2 IMPLANT
GLOVE BIOGEL PI INDICATOR 8 (GLOVE) ×2
GLOVE SURG ORTHO 8.0 STRL STRW (GLOVE) ×2 IMPLANT
GLOVE SURG SS PI 7.5 STRL IVOR (GLOVE) ×2 IMPLANT
GOWN STRL REUS W/TWL XL LVL3 (GOWN DISPOSABLE) ×4 IMPLANT
HANDPIECE INTERPULSE COAX TIP (DISPOSABLE) ×1
HOLDER FOLEY CATH W/STRAP (MISCELLANEOUS) IMPLANT
HOOD PEEL AWAY FLYTE STAYCOOL (MISCELLANEOUS) ×2 IMPLANT
IMMOBILIZER KNEE 20 (SOFTGOODS) ×2
IMMOBILIZER KNEE 20 THIGH 36 (SOFTGOODS) ×1 IMPLANT
KIT TURNOVER KIT A (KITS) ×2 IMPLANT
MANIFOLD NEPTUNE II (INSTRUMENTS) ×2 IMPLANT
NEEDLE HYPO 22GX1.5 SAFETY (NEEDLE) ×4 IMPLANT
NS IRRIG 1000ML POUR BTL (IV SOLUTION) IMPLANT
PACK ICE MAXI GEL EZY WRAP (MISCELLANEOUS) IMPLANT
PACK TOTAL KNEE CUSTOM (KITS) ×2 IMPLANT
PENCIL SMOKE EVACUATOR (MISCELLANEOUS) ×2 IMPLANT
PIN DRILL FIX HALF THREAD (BIT) ×2 IMPLANT
PIN STEINMAN FIXATION KNEE (PIN) ×2 IMPLANT
PROTECTOR NERVE ULNAR (MISCELLANEOUS) ×2 IMPLANT
SET HNDPC FAN SPRY TIP SCT (DISPOSABLE) ×1 IMPLANT
SUT ETHIBOND NAB CT1 #1 30IN (SUTURE) ×4 IMPLANT
SUT MNCRL AB 3-0 PS2 18 (SUTURE) ×2 IMPLANT
SUT VIC AB 0 CT1 36 (SUTURE) ×2 IMPLANT
SUT VIC AB 2-0 CT1 27 (SUTURE) ×4
SUT VIC AB 2-0 CT1 TAPERPNT 27 (SUTURE) ×2 IMPLANT
SYR CONTROL 10ML LL (SYRINGE) ×6 IMPLANT
TIBIA ATTUNE KNEE SYS BASE SZ6 (Knees) ×2 IMPLANT
TRAY FOLEY MTR SLVR 16FR STAT (SET/KITS/TRAYS/PACK) IMPLANT
WATER STERILE IRR 1000ML POUR (IV SOLUTION) ×4 IMPLANT
WRAP KNEE MAXI GEL POST OP (GAUZE/BANDAGES/DRESSINGS) ×2 IMPLANT
YANKAUER SUCT BULB TIP 10FT TU (MISCELLANEOUS) ×2 IMPLANT

## 2019-08-20 NOTE — Progress Notes (Signed)
Pt did not pass PT eval, call in to Dr French Ana, message left with answering service.  Bed 1309 assigned and son Kristina Washington notified.

## 2019-08-20 NOTE — Plan of Care (Signed)

## 2019-08-20 NOTE — Anesthesia Procedure Notes (Signed)
Spinal  Patient location during procedure: OR End time: 08/20/2019 7:47 AM Staffing Performed: resident/CRNA  Resident/CRNA: Lissa Morales, CRNA Preanesthetic Checklist Completed: patient identified, IV checked, site marked, risks and benefits discussed, surgical consent, monitors and equipment checked, pre-op evaluation and timeout performed Spinal Block Patient position: sitting Prep: DuraPrep Patient monitoring: heart rate, continuous pulse ox and blood pressure Approach: midline Location: L3-4 Injection technique: single-shot Needle Needle type: Pencan  Needle gauge: 24 G Needle length: 9 cm Additional Notes Expiration date of kit checked and confirmed. Patient tolerated procedure well, without complications.

## 2019-08-20 NOTE — Brief Op Note (Signed)
08/20/2019  10:03 AM  PATIENT:  Kristina Washington  59 y.o. female  PRE-OPERATIVE DIAGNOSIS:  OA RIGHT KNEE  POST-OPERATIVE DIAGNOSIS:  OA RIGHT KNEE  PROCEDURE:  Procedure(s): TOTAL KNEE ARTHROPLASTY (Right)  SURGEON:  Surgeon(s) and Role:    Earlie Server, MD - Primary  PHYSICIAN ASSISTANT: Chriss Czar, PA-C  ASSISTANTS: OR staff x1   ANESTHESIA:   local, regional, spinal and IV sedation  EBL:  50 mL   BLOOD ADMINISTERED:none  DRAINS: none   LOCAL MEDICATIONS USED:  MARCAINE     SPECIMEN:  No Specimen  DISPOSITION OF SPECIMEN:  N/A  COUNTS:  YES  TOURNIQUET:   Total Tourniquet Time Documented: Thigh (Right) - 63 minutes Total: Thigh (Right) - 63 minutes   DICTATION: .Other Dictation: Dictation Number unknown  PLAN OF CARE: Discharge to home after PACU  PATIENT DISPOSITION:  PACU - hemodynamically stable.   Delay start of Pharmacological VTE agent (>24hrs) due to surgical blood loss or risk of bleeding: yes

## 2019-08-20 NOTE — Anesthesia Procedure Notes (Signed)
Procedure Name: MAC Date/Time: 08/20/2019 7:38 AM Performed by: Lissa Morales, CRNA Pre-anesthesia Checklist: Patient identified, Emergency Drugs available, Suction available and Patient being monitored Patient Re-evaluated:Patient Re-evaluated prior to induction Oxygen Delivery Method: Simple face mask Placement Confirmation: positive ETCO2

## 2019-08-20 NOTE — Progress Notes (Signed)
Called Dr Alvester Morin cell phone and left message regarding Pt not passing PT eval, Pt admitted to 1309 with bedside report to [place], [place]

## 2019-08-20 NOTE — Discharge Instructions (Signed)

## 2019-08-20 NOTE — Evaluation (Signed)
Physical Therapy Evaluation Patient Details Name: Kristina Washington MRN: [id number] DOB: 1961/02/23 Today's Date: 08/20/2019   History of Present Illness  Patient is 59 y.o. female s/p Rt TKA on 08/20/19 with PMH significant for MVP, HTN, OA.    Clinical Impression  Kristina Washington is a 59 y.o. female POD 0 s/p Rt TKA. Patient reports independence with mobility at baseline. Patient is now limited by functional impairments (see PT problem list below) and requires min guard/assist for transfers and gait with RW. Patient was able to ambulate ~20' and 30' feet with RW and min guard/assist. Patient was limited by nausea, headache, and weak feeling with ambulation and required knee immobilizer on Rt LE to prevent knee buckling with mobility. Patient instructed in exercise to facilitate circulation. Patient will benefit from continued skilled PT interventions to address impairments and progress towards PLOF. Acute PT will follow to progress mobility and stair training in preparation for safe discharge home.     Follow Up Recommendations Follow surgeon's recommendation for DC plan and follow-up therapies    Equipment Recommendations  None recommended by PT    Recommendations for Other Services       Precautions / Restrictions Precautions Precautions: Fall Restrictions Weight Bearing Restrictions: No      Mobility  Bed Mobility Overal bed mobility: Needs Assistance Bed Mobility: Supine to Sit;Sit to Supine     Supine to sit: HOB elevated;Min assist Sit to supine: Min guard;HOB elevated;Min assist   General bed mobility comments: pt required light min assist for Rt LE mobility due to knee immobilizer bulk, pt instructed on use of gait belt to assist with raising LE into bed.  Transfers Overall transfer level: Needs assistance Equipment used: Rolling walker (2 wheeled) Transfers: Sit to/from [employer] Sit to Stand: Min guard;Supervision Stand pivot transfers: Min  guard;Min assist       General transfer comment: cues for hand placement and technique with RW to perform power up. supervision with transfer from EOB, min guard and cues for use of grab bar to raise from toilet and for transfer from wheelchair. min guard/assist for stand pivot to return to bed from wheelchair after pt became nauseous during gait.  Ambulation/Gait Ambulation/Gait assistance: Min assist;Min guard Gait Distance (Feet): 50 Feet(1x 20, 1x 30) Assistive device: Rolling walker (2 wheeled) Gait Pattern/deviations: Step-to pattern;Decreased stride length;Decreased weight shift to right Gait velocity: decreased   General Gait Details: cues for safe step pattern within RW and safe proximity. pt required intermittent min assist to steady and experienced headeache, nausea, and weak feeling at end of each short bout of gait requiring seated rest break.  Stairs       Wheelchair Mobility    Modified Rankin (Stroke Patients Only)       Balance Overall balance assessment: Needs assistance Sitting-balance support: Feet supported Sitting balance-Leahy Scale: Good     Standing balance support: Bilateral upper extremity supported;During functional activity Standing balance-Leahy Scale: Poor            Pertinent Vitals/Pain Pain Assessment: 0-10 Pain Score: 4  Pain Location: Rt knee Pain Descriptors / Indicators: Aching;Discomfort Pain Intervention(s): Limited activity within patient's tolerance;Monitored during session;Repositioned    Home Living Family/patient expects to be discharged to:: Private residence Living Arrangements: Children(pt lives with adult son) Available Help at Discharge: Family(son and daughter will both help her initially) Type of Home: House Home Access: Level entry     Home Layout: One level Home Equipment: Environmental consultant - 2 wheels;Cane - single  point      Prior Function Level of Independence: Independent          Hand Dominance   Dominant  Hand: Right    Extremity/Trunk Assessment   Upper Extremity Assessment Upper Extremity Assessment: Overall WFL for tasks assessed    Lower Extremity Assessment Lower Extremity Assessment: RLE deficits/detail RLE Deficits / Details: pt with poor quad activation, unable to complete SLR, immobilizer donned for mobility RLE: Unable to fully assess due to immobilization RLE Sensation: WNL RLE Coordination: WNL    Cervical / Trunk Assessment Cervical / Trunk Assessment: Normal  Communication   Communication: No difficulties  Cognition Arousal/Alertness: Awake/alert Behavior During Therapy: WFL for tasks assessed/performed Overall Cognitive Status: Within Functional Limits for tasks assessed         General Comments      Exercises Total Joint Exercises Ankle Circles/Pumps: AROM;Both;Supine;10 reps   Assessment/Plan    PT Assessment Patient needs continued PT services  PT Problem List Decreased strength;Decreased balance;Decreased mobility;Decreased range of motion;Decreased activity tolerance;Decreased knowledge of use of DME       PT Treatment Interventions DME instruction;Functional mobility training;Balance training;Patient/family education;Therapeutic activities;Gait training;Stair training;Therapeutic exercise    PT Goals (Current goals can be found in the Care Plan section)  Acute Rehab PT Goals Patient Stated Goal: to get back to independent level with mobility PT Goal Formulation: With patient Time For Goal Achievement: 08/27/19 Potential to Achieve Goals: Good    Frequency 7X/week    AM-PAC PT "6 Clicks" Mobility  Outcome Measure Help needed turning from your back to your side while in a flat bed without using bedrails?: A Little Help needed moving from lying on your back to sitting on the side of a flat bed without using bedrails?: A Little Help needed moving to and from a bed to a chair (including a wheelchair)?: A Little Help needed standing up from a  chair using your arms (e.g., wheelchair or bedside chair)?: A Little Help needed to walk in hospital room?: A Little Help needed climbing 3-5 steps with a railing? : A Little 6 Click Score: 18    End of Session Equipment Utilized During Treatment: Gait belt;Right knee immobilizer Activity Tolerance: Patient tolerated treatment well Patient left: in bed;with call bell/phone within reach Nurse Communication: Mobility status PT Visit Diagnosis: Muscle weakness (generalized) (M62.81);Difficulty in walking, not elsewhere classified (R26.2)    Time: [id number] PT Time Calculation (min) (ACUTE ONLY): 34 min   Charges:   PT Evaluation $PT Eval Low Complexity: 1 Low PT Treatments $Gait Training: 8-22 mins       Verner Mould, DPT Physical Therapist with [facility] [phone]  08/20/2019 5:45 PM

## 2019-08-20 NOTE — Anesthesia Procedure Notes (Signed)
Anesthesia Regional Block: Adductor canal block   Pre-Anesthetic Checklist: ,, timeout performed, Correct Patient, Correct Site, Correct Laterality, Correct Procedure, Correct Position, site marked, Risks and benefits discussed, pre-op evaluation,  At surgeon's request and post-op pain management  Laterality: Right  Prep: Maximum Sterile Barrier Precautions used, chloraprep       Needles:  Injection technique: Single-shot  Needle Type: Echogenic Stimulator Needle     Needle Length: 9cm  Needle Gauge: 21     Additional Needles:   Procedures:,,,, ultrasound used (permanent image in chart),,,,  Narrative:  Start time: 08/20/2019 6:55 AM End time: 08/20/2019 7:05 AM Injection made incrementally with aspirations every 5 mL.  Performed by: Personally  Anesthesiologist: Roderic Palau, MD  Additional Notes: 2% Lidocaine skin wheel.

## 2019-08-20 NOTE — Op Note (Signed)
NAMEARELI, Kristina Washington MEDICAL RECORD [id number] ACCOUNT [id number] DATE OF BIRTH:[birth year] FACILITY: [facility] LOCATION: [facility] PHYSICIAN:W. Chigozie Basaldua JR., MD  OPERATIVE REPORT  DATE OF PROCEDURE:  08/20/2019  PREOPERATIVE DIAGNOSIS:  Severe osteoarthritis with varus deformity with recurvatum of right knee.  POSTOPERATIVE DIAGNOSIS:  Severe osteoarthritis with varus deformity with recurvatum of right knee.   OPERATION:  Right total knee replacement (Attune DePuy size 5 femur, size 6 tibia, 8 mm size 5 tibia, and 38 mm all-poly patella, cemented).  SURGEON:  Vangie Bicker, MD  ASSISTANT:  Jennet Maduro, PA  TOURNIQUET TIME:  61 minutes.  ANESTHESIA:  Spinal with an adductor block.  DESCRIPTION OF PROCEDURE:  Supine position and inflation of thigh tourniquet exsanguination of the leg 350.  Straight skin incision with a medial parapatellar approach to the knee made.  We cut a 5-degree 9 mm cut off the femur, followed by cutting 9 mm  below the least diseased lateral compartment with varus deformity noted in the knee.  The extension gap was eventually measured at 8 mm.  We did do a minimal cut though with barely resecting any medial bone, but despite this due to the patient's  preoperative noted recurvatum of her knee, did end up with an 8 mm variant.  Femur was sized to be a size 5 followed by placement of the all-in-1 cutting block in appropriate degree of external rotation cutting the anterior, posterior chamfer cuts.   Flexion gap equaled the extension gap at 8 mm.  PCL was released.  Menisci were excised.  Small osteophytes were removed from the posterior aspect of the knee.  Patella was cut after the keel hole was cut for the tibia as well as the box cut for the  femur.  Trials were placed.  We settled on the 8 mm thick size 5 bearing.  Cement was prepared on the back table in the doughy state and inserted tibia, followed by femur and patella.  Cement was allowed to  harden with the trial bearing in.  We again  trialed several thicknesses of bearing and settled on the 8 mm thick bearing.  Small bits of cement were removed from the knee under direct vision.  Tourniquet was released.  No excessive bleeding was noted.  Final bearing was placed.  Excellent  stability and tracking was noted.  Full extension.  Good balance between the medial and lateral side of the knee and no tendency for bearing and stability.  Closure was affected with #1 Ethibond 0, 2-0 Vicryl and Monocryl.  A lightly compressive sterile  dressing and knee immobilizer applied.  Taken to recovery room in stable condition.  CN/NUANCE  D:08/20/2019 T:08/20/2019 JOB:[id number]

## 2019-08-20 NOTE — Interval H&P Note (Signed)
History and Physical Interval Note:  08/20/2019 7:33 AM  Kristina Washington  has presented today for surgery, with the diagnosis of OA RIGHT KNEE.  The various methods of treatment have been discussed with the patient and family. After consideration of risks, benefits and other options for treatment, the patient has consented to  Procedure(s): TOTAL KNEE ARTHROPLASTY (Right) as a surgical intervention.  The patient's history has been reviewed, patient examined, no change in status, stable for surgery.  I have reviewed the patient's chart and labs.  Questions were answered to the patient's satisfaction.     Yvette Rack

## 2019-08-20 NOTE — Anesthesia Postprocedure Evaluation (Signed)
Anesthesia Post Note  Patient: Kristina Washington  Procedure(s) Performed: TOTAL KNEE ARTHROPLASTY (Right Knee)     Patient location during evaluation: PACU Anesthesia Type: MAC, Spinal and Regional Level of consciousness: oriented and awake and alert Pain management: pain level controlled Vital Signs Assessment: post-procedure vital signs reviewed and stable Respiratory status: spontaneous breathing and respiratory function stable Cardiovascular status: blood pressure returned to baseline and stable Postop Assessment: no headache, no backache, no apparent nausea or vomiting, patient able to bend at knees and spinal receding Anesthetic complications: no    Last Vitals:  Vitals:   08/20/19 1100 08/20/19 1108  BP: (!) 156/85 (!) 156/83  Pulse: 69 66  Resp: 15   Temp: 36.4 C (!) 36.2 C  SpO2: 95% 95%    Last Pain:  Vitals:   08/20/19 1108  TempSrc: Axillary  PainSc: 4                  Leomia Blake,W. EDMOND

## 2019-08-20 NOTE — Transfer of Care (Signed)
Immediate Anesthesia Transfer of Care Note  Patient: Kristina Washington  Procedure(s) Performed: TOTAL KNEE ARTHROPLASTY (Right Knee)  Patient Location: PACU  Anesthesia Type:Spinal  Level of Consciousness: awake, alert , oriented and patient cooperative  Airway & Oxygen Therapy: Patient Spontanous Breathing and Patient connected to face mask oxygen  Post-op Assessment: Report given to RN, Post -op Vital signs reviewed and stable and Patient moving all extremities X 4  Post vital signs: stable  Last Vitals:  Vitals Value Taken Time  BP 140/83 08/20/19 1015  Temp 36.5 C 08/20/19 1008  Pulse 68 08/20/19 1016  Resp 17 08/20/19 1016  SpO2 100 % 08/20/19 1016  Vitals shown include unvalidated device data.  Last Pain:  Vitals:   08/20/19 1008  TempSrc:   PainSc: 9       Patients Stated Pain Goal: 4 ([id number])  Complications: No apparent anesthesia complications

## 2019-08-21 DIAGNOSIS — M1711 Unilateral primary osteoarthritis, right knee: Secondary | ICD-10-CM | POA: Diagnosis not present

## 2019-08-21 NOTE — Progress Notes (Signed)
DC instructions given to patient and explained in detail. The patient verbalized understanding of the dc instructions. Patient escorted to lobby in wheelchair to be discharged home with significant other.

## 2019-08-21 NOTE — Progress Notes (Signed)
Physical Therapy Treatment Patient Details Name: Kristina Washington MRN: [id number] DOB: 10/14/1960 Today's Date: 08/21/2019    History of Present Illness Patient is 59 y.o. female s/p Rt TKA on 08/20/19 with PMH significant for MVP, HTN, OA.    PT Comments    Pt progressing well; reviewed safety with mobility, pt amb 160' with supervision, reviewed TKA HEP. Ready for d/c from PT standpoint  Follow Up Recommendations  Follow surgeon's recommendation for DC plan and follow-up therapies     Equipment Recommendations  None recommended by PT    Recommendations for Other Services       Precautions / Restrictions Precautions Precautions: Fall;Knee Precaution Comments: independent SLRs x 5 today, KI not utilized  Restrictions Weight Bearing Restrictions: No RLE Weight Bearing: Weight bearing as tolerated    Mobility  Bed Mobility Overal bed mobility: Needs Assistance Bed Mobility: Supine to Sit;Sit to Supine     Supine to sit: Supervision Sit to supine: Supervision   General bed mobility comments: using gait belt to self assist, bed flat  Transfers Overall transfer level: Needs assistance Equipment used: Rolling walker (2 wheeled) Transfers: Sit to/from Stand Sit to Stand: Supervision;Modified independent (Device/Increase time)         General transfer comment: cues for hand placement initially, pt self corrects end of session  Ambulation/Gait Ambulation/Gait assistance: Supervision Gait Distance (Feet): 160 Feet Assistive device: Rolling walker (2 wheeled) Gait Pattern/deviations: Step-to pattern;Decreased weight shift to right;Step-through pattern;Decreased stride length     General Gait Details: cues for sequence, step through/progression of gait    Stairs             Wheelchair Mobility    Modified Rankin (Stroke Patients Only)       Balance                                            Cognition Arousal/Alertness:  Awake/alert Behavior During Therapy: WFL for tasks assessed/performed Overall Cognitive Status: Within Functional Limits for tasks assessed                                        Exercises Total Joint Exercises Ankle Circles/Pumps: AROM;Both;Supine;10 reps Quad Sets: AROM;Both;10 reps Heel Slides: AAROM;Right;10 reps Hip ABduction/ADduction: AROM;Right;10 reps Straight Leg Raises: AAROM;AROM;Right;10 reps Long Arc Quad: AAROM;Right;5 reps Knee Flexion: AAROM;Right;5 reps    General Comments        Pertinent Vitals/Pain Pain Assessment: 0-10 Pain Score: 3  Pain Location: Rt knee Pain Descriptors / Indicators: Aching;Discomfort Pain Intervention(s): Limited activity within patient's tolerance;Monitored during session;Premedicated before session;Repositioned    Home Living                      Prior Function            PT Goals (current goals can now be found in the care plan section) Acute Rehab PT Goals Patient Stated Goal: to get back to independent level with mobility PT Goal Formulation: With patient Time For Goal Achievement: 08/27/19 Potential to Achieve Goals: Good Progress towards PT goals: Progressing toward goals    Frequency    7X/week      PT Plan Current plan remains appropriate    Co-evaluation  AM-PAC PT "6 Clicks" Mobility   Outcome Measure  Help needed turning from your back to your side while in a flat bed without using bedrails?: None Help needed moving from lying on your back to sitting on the side of a flat bed without using bedrails?: None Help needed moving to and from a bed to a chair (including a wheelchair)?: A Little Help needed standing up from a chair using your arms (e.g., wheelchair or bedside chair)?: A Little Help needed to walk in hospital room?: A Little Help needed climbing 3-5 steps with a railing? : A Little 6 Click Score: 20    End of Session Equipment Utilized During  Treatment: Gait belt Activity Tolerance: Patient tolerated treatment well Patient left: in bed;with call bell/phone within reach Nurse Communication: Mobility status PT Visit Diagnosis: Muscle weakness (generalized) (M62.81);Difficulty in walking, not elsewhere classified (R26.2)     Time: [id number] PT Time Calculation (min) (ACUTE ONLY): 17 min  Charges:  $Gait Training: 8-22 mins                     Charm Stenner, PT   Acute Rehab Dept [facility]): [id number]   08/21/2019    [facility] 08/21/2019, 12:10 PM

## 2019-08-21 NOTE — TOC Progression Note (Signed)
Transition of Care [facility]) - Progression Note    Patient Details  Name: Kristina Washington MRN: [id number] Date of Birth: 06/26/1960  [facility]) CM/SW Contact  Joaquin Courts, RN Phone Number: 08/21/2019, 11:48 AM  Clinical Narrative: \ CM spoke with patient. Patient set up with [facility] at home for [place]. Patient reports she has rolling walker and 3in1.       Expected Discharge Plan: [place] Barriers to Discharge: No Barriers Identified  Expected Discharge Plan and Services Expected Discharge Plan: [place]   Discharge Planning Services: CM Consult Post Acute Care Choice: [place] arrangements for the past 2 months: Single Family Home Expected Discharge Date: 08/20/19               DME Arranged: Gilford Rile rolling, CPM DME Agency: [employer]       HH Arranged: PT HH Agency: [facility] [employer] [facility] [employer]         Social Determinants of Health ([place]) Interventions    Readmission Risk Interventions No flowsheet data found.

## 2019-08-23 ENCOUNTER — Encounter: Payer: Self-pay | Admitting: *Deleted

## 2019-09-16 ENCOUNTER — Ambulatory Visit: Payer: 59 | Attending: Internal Medicine

## 2019-09-16 DIAGNOSIS — Z23 Encounter for immunization: Secondary | ICD-10-CM

## 2019-09-16 NOTE — Progress Notes (Signed)
   Covid-19 Vaccination Clinic  Name:  Kristina Washington    MRN: [id number] DOB: 1960/10/14  09/16/2019  Ms. Gatewood was observed post Covid-19 immunization for 15 minutes without incident. She was provided with Vaccine Information Sheet and instruction to access the V-Safe system.   Ms. Wierman was instructed to call 911 with any severe reactions post vaccine: Marland Kitchen Difficulty breathing  . Swelling of face and throat  . A fast heartbeat  . A bad rash all over body  . Dizziness and weakness   Immunizations Administered    Name Date Dose VIS Date Route   Pfizer COVID-19 Vaccine 09/16/2019 11:30 AM 0.3 mL 05/28/2019 Intramuscular   Manufacturer: [employer], [employer]   Lot: [id number]   [place]: [id number]

## 2019-10-11 ENCOUNTER — Ambulatory Visit: Payer: 59 | Attending: Internal Medicine

## 2019-10-11 DIAGNOSIS — Z23 Encounter for immunization: Secondary | ICD-10-CM

## 2019-10-11 NOTE — Progress Notes (Signed)
   Covid-19 Vaccination Clinic  Name:  Kristina Washington    MRN: [id number] DOB: 1960/10/20  10/11/2019  Kristina Washington was observed post Covid-19 immunization for 15 minutes without incident. She was provided with Vaccine Information Sheet and instruction to access the V-Safe system.   Kristina Washington was instructed to call 911 with any severe reactions post vaccine: Marland Kitchen Difficulty breathing  . Swelling of face and throat  . A fast heartbeat  . A bad rash all over body  . Dizziness and weakness   Immunizations Administered    Name Date Dose VIS Date Route   Pfizer COVID-19 Vaccine 10/11/2019  3:29 PM 0.3 mL 08/11/2018 Intramuscular   Manufacturer: [place]   Lot: [id number]   [place]: [id number]

## 2019-12-13 ENCOUNTER — Other Ambulatory Visit: Payer: Self-pay

## 2019-12-13 ENCOUNTER — Other Ambulatory Visit (HOSPITAL_COMMUNITY): Payer: Self-pay | Admitting: Orthopedic Surgery

## 2019-12-13 ENCOUNTER — Ambulatory Visit (HOSPITAL_COMMUNITY)
Admission: RE | Admit: 2019-12-13 | Discharge: 2019-12-13 | Disposition: A | Discharge status: Home / Self Care | Payer: 59 | Source: Ambulatory Visit | Attending: Orthopedic Surgery | Admitting: Orthopedic Surgery

## 2019-12-13 DIAGNOSIS — M79604 Pain in right leg: Secondary | ICD-10-CM | POA: Diagnosis not present

## 2019-12-13 DIAGNOSIS — M7989 Other specified soft tissue disorders: Secondary | ICD-10-CM | POA: Diagnosis not present

## 2019-12-13 NOTE — Progress Notes (Signed)
Lower extremity venous RT study completed.   Preliminary results given to [facility].  Darlin Coco

## 2020-01-31 ENCOUNTER — Encounter: Payer: BC Managed Care – PPO | Admitting: Nurse Practitioner

## 2020-02-11 ENCOUNTER — Encounter: Payer: Self-pay | Admitting: Podiatry

## 2020-02-11 ENCOUNTER — Ambulatory Visit (INDEPENDENT_AMBULATORY_CARE_PROVIDER_SITE_OTHER): Payer: Managed Care, Other (non HMO)

## 2020-02-11 ENCOUNTER — Ambulatory Visit (INDEPENDENT_AMBULATORY_CARE_PROVIDER_SITE_OTHER): Payer: Managed Care, Other (non HMO) | Admitting: Podiatry

## 2020-02-11 ENCOUNTER — Other Ambulatory Visit: Payer: Self-pay

## 2020-02-11 DIAGNOSIS — M779 Enthesopathy, unspecified: Secondary | ICD-10-CM | POA: Diagnosis not present

## 2020-02-11 DIAGNOSIS — M7672 Peroneal tendinitis, left leg: Secondary | ICD-10-CM | POA: Diagnosis not present

## 2020-02-14 NOTE — Progress Notes (Signed)
Subjective:   Patient ID: Kristina Washington, female   DOB: 59 y.o.   MRN: [id number]   HPI Patient states she is having a lot of pain in the outside of her left foot and also has some midfoot pain.  States the pain in the outside is been present for a while and making it hard to walk comfortably is active and does not smoke.  States is been going on for several months   Review of Systems  All other systems reviewed and are negative.       Objective:  Physical Exam Vitals and nursing note reviewed.  Constitutional:      Appearance: She is well-developed.  Pulmonary:     Effort: Pulmonary effort is normal.  Musculoskeletal:        General: Normal range of motion.  Skin:    General: Skin is warm.  Neurological:     Mental Status: She is alert.     Neurovascular status intact muscle strength found to be adequate range of motion within normal limits.  Patient is found to have quite a bit of discomfort in the peroneal tendon left at its insertion base of the fifth metatarsal and I did not note any muscle strength loss or other pathology.  Patient is found to have good digital perfusion well oriented x3     Assessment:  Peroneal tendinitis left with inflammation fluid at the insertion base of fifth metatarsal     Plan:  H&P condition reviewed.  Today I did sterile prep and injected the fifth metatarsal base 3 mg Dexasone Kenalog 5 mg Xylocaine after explaining risk and applied fascial brace to hold up the arch and gave instructions for supportive shoes with rigid bottoms.  Patient will reduce activity and will be seen back as needed  X-rays were negative for signs of fracture or bone injury

## 2020-02-17 ENCOUNTER — Other Ambulatory Visit: Payer: Self-pay | Admitting: Podiatry

## 2020-02-17 DIAGNOSIS — M7672 Peroneal tendinitis, left leg: Secondary | ICD-10-CM

## 2020-07-25 ENCOUNTER — Ambulatory Visit (INDEPENDENT_AMBULATORY_CARE_PROVIDER_SITE_OTHER): Payer: Managed Care, Other (non HMO) | Admitting: Pulmonary Disease

## 2020-07-25 ENCOUNTER — Other Ambulatory Visit: Payer: Self-pay

## 2020-07-25 ENCOUNTER — Encounter: Payer: Self-pay | Admitting: Pulmonary Disease

## 2020-07-25 VITALS — BP 124/80 | HR 90 | Temp 98.1°F | Ht 70.0 in | Wt 285.0 lb

## 2020-07-25 DIAGNOSIS — J45909 Unspecified asthma, uncomplicated: Secondary | ICD-10-CM

## 2020-07-25 DIAGNOSIS — J45901 Unspecified asthma with (acute) exacerbation: Secondary | ICD-10-CM

## 2020-07-25 MED ORDER — AMOXICILLIN-POT CLAVULANATE 875-125 MG PO TABS: 1.0000 | ORAL_TABLET | Freq: Two times a day (BID) | ORAL | 0 refills | Status: DC

## 2020-07-25 MED ORDER — PREDNISONE 20 MG PO TABS: ORAL_TABLET | ORAL | 0 refills | Status: DC

## 2020-07-25 NOTE — Progress Notes (Signed)
Kristina Washington    [id number]    [birth year]  Primary Care Physician:Kim, Jeneen Rinks, MD  Referring Physician: Jani Gravel, MD [address] [place],  [place] [address]  Chief complaint:  History of asthma Recent exacerbation of symptoms following Covid exposure  HPI:  Increased cough, increased shortness of breath, increased use of nebulizer Not really coughing up any expectorations but knows she has some phlegm around and around Has had no fever or chills Covid test positive about 2 weeks ago   Continues to use [place], rescue inhaler use as needed There has been no recent change in work or home environment to account for the exacerbation of symptoms Has had bronchitis many times in the past  Never smoker  Being evaluated for knee surgery  Outpatient Encounter Medications as of 07/25/2020  Medication Sig  . albuterol (PROVENTIL HFA;VENTOLIN HFA) 108 (90 Base) MCG/ACT inhaler Inhale 2 puffs into the lungs every 4 (four) hours as needed for wheezing.  Marland Kitchen albuterol (PROVENTIL) (2.5 MG/3ML) 0.083% nebulizer solution Take 2.5 mg by nebulization every 6 (six) hours as needed for wheezing or shortness of breath.  . Ascorbic Acid (VITAMIN C PO) Take 1 tablet by mouth daily.  . Cholecalciferol (VITAMIN D3) 50 MCG (2000 UT) TABS Take 2,000 Units by mouth daily.  . Cyanocobalamin (VITAMIN B 12 PO) Take by mouth.  . cyclobenzaprine (FLEXERIL) 10 MG tablet Take 1 tablet (10 mg total) by mouth 3 (three) times daily as needed for muscle spasms.  . fluticasone furoate-vilanterol (BREO ELLIPTA) 200-25 MCG/INH AEPB Inhale 1 puff into the lungs daily.  . hydrochlorothiazide (HYDRODIURIL) 25 MG tablet Take 25 mg by mouth daily.  Marland Kitchen ketoconazole (NIZORAL) 2 % cream Apply topically daily.  Marland Kitchen losartan (COZAAR) 100 MG tablet Take 100 mg by mouth daily.  . montelukast (SINGULAIR) 10 MG tablet Take 1 tablet (10 mg total) by mouth daily. (Patient taking differently: Take 10 mg by mouth at  bedtime.)  . Multiple Vitamin (MULTIVITAMIN WITH MINERALS) TABS tablet Take 1 tablet by mouth daily.  . simvastatin (ZOCOR) 10 MG tablet Take 10 mg by mouth at bedtime.  Marland Kitchen acetaminophen (TYLENOL) 325 MG tablet Take 2 tablets (650 mg total) by mouth every 4 (four) hours as needed.  . celecoxib (CELEBREX) 200 MG capsule Take 1 capsule (200 mg total) by mouth 2 (two) times daily.  Marland Kitchen gabapentin (NEURONTIN) 300 MG capsule Take 1 capsule (300 mg total) by mouth 3 (three) times daily for 14 days, THEN 1 capsule (300 mg total) 2 (two) times daily for 3 days, THEN 1 capsule (300 mg total) daily for 4 days.  Marland Kitchen oxyCODONE (OXY IR/ROXICODONE) 5 MG immediate release tablet Take one tab po q4-6hrs prn pain, may need 1-2 first couple weeks  . Phentermine-Topiramate (QSYMIA) 7.5-46 MG CP24 Take 1 capsule by mouth daily before breakfast.   No facility-administered encounter medications on file as of 07/25/2020.    Allergies as of 07/25/2020  . (No Known Allergies)    Past Medical History:  Diagnosis Date  . Arthritis   . Bronchial asthma   . Bronchitis   . Cataracts, bilateral   . Complication of anesthesia    slow to wake up  . Headache   . Heart murmur    mild no cardiologist Dr. Maudie Mercury   . Hypercholesterolemia   . Hypertension   . Menorrhagia   . MVP (mitral valve prolapse)   . Pre-diabetes    pt. states  pre Dm took metformin for a brief time as part of a weight loss program .no longer takes metformin Denies DM    Past Surgical History:  Procedure Laterality Date  . CARPAL TUNNEL RELEASE     left wrist  . ENDOMETRIAL ABLATION  03/23/10  . FOOT SURGERY     BOTH RIGHT AND LEFT  . KNEE ARTHROSCOPY  2002   right knee  . TOTAL KNEE ARTHROPLASTY Right 08/20/2019   Procedure: TOTAL KNEE ARTHROPLASTY;  Surgeon: Earlie Server, MD;  Location: [facility];  Service: Orthopedics;  Laterality: Right;  . TUBAL LIGATION      Family History  Problem Relation Age of Onset  . Hypertension Father   . Cancer  Father        COLON  . Diabetes Mother   . Hypertension Mother   . Cancer Mother   . Diabetes Sister   . Hypertension Sister   . Diabetes Paternal Aunt   . Diabetes Paternal Uncle     Social History   Socioeconomic History  . Marital status: Divorced    Spouse name: Not on file  . Number of children: Not on file  . Years of education: Not on file  . Highest education level: Not on file  Occupational History  . Not on file  Tobacco Use  . Smoking status: Never Smoker  . Smokeless tobacco: Never Used  Vaping Use  . Vaping Use: Never used  Substance and Sexual Activity  . Alcohol use: Not Currently  . Drug use: No  . Sexual activity: Not Currently    Birth control/protection: Condom    Comment: intercourse age 46, more than 5 sexual partners, des neg  Other Topics Concern  . Not on file  Social History Narrative  . Not on file   Social Determinants of Health   Financial Resource Strain: Not on file  Food Insecurity: Not on file  Transportation Needs: Not on file  Physical Activity: Not on file  Stress: Not on file  Social Connections: Not on file  Intimate Partner Violence: Not on file    Review of Systems  Constitutional: Negative.  Negative for fatigue.  HENT: Negative.   Eyes: Negative.   Respiratory: Negative for shortness of breath.   Cardiovascular: Negative.   Gastrointestinal: Negative.   Endocrine: Negative.   Genitourinary: Negative.     Vitals:   07/25/20 1618  BP: 124/80  Pulse: 90  Temp: 98.1 F (36.7 C)  SpO2: 99%     Physical Exam Constitutional:      Appearance: She is well-developed. She is obese. She is not diaphoretic.  HENT:     Mouth/Throat:     Mouth: Mucous membranes are moist.  Eyes:     Pupils: Pupils are equal, round, and reactive to light.  Neck:     Thyroid: No thyromegaly.     Trachea: No tracheal deviation.  Cardiovascular:     Rate and Rhythm: Normal rate and regular rhythm.     Heart sounds: No murmur  heard. No friction rub.  Pulmonary:     Effort: Pulmonary effort is normal. No respiratory distress.     Breath sounds: No stridor. No wheezing or rhonchi.  Musculoskeletal:     Cervical back: Normal range of motion and neck supple.  Neurological:     Mental Status: She is alert.  Psychiatric:        Mood and Affect: Mood normal.    Data Reviewed:   Assessment:  Asthma  appears well controlled  Exacerbation with recent Covid exposure  Cough, increased chronic shortness of breath  Plan/Recommendations:  Prednisone 20 mg daily  Augmentin 875 p.o. twice daily for 7 days  Continue maintenance inhalers  Continue inhalers  Encouraged to call us with any significant concerns  Sherrilyn Rist MD [facility] 07/25/2020, 4:32 PM  CC: Jani Gravel, MD

## 2020-07-25 NOTE — Patient Instructions (Signed)
Exacerbation of asthma  We will call you in a course of Augmentin 875 p.o. twice daily for 7 days Prednisone 20 mg daily for 14 days  Call with significant concerns  I will see you back in 6 months

## 2021-02-20 ENCOUNTER — Other Ambulatory Visit: Payer: Self-pay

## 2021-02-20 ENCOUNTER — Ambulatory Visit
Admission: EM | Admit: 2021-02-20 | Discharge: 2021-02-20 | Disposition: A | Discharge status: Home / Self Care | Payer: Managed Care, Other (non HMO) | Attending: Internal Medicine | Admitting: Internal Medicine

## 2021-02-20 DIAGNOSIS — R059 Cough, unspecified: Secondary | ICD-10-CM

## 2021-02-20 DIAGNOSIS — R197 Diarrhea, unspecified: Secondary | ICD-10-CM

## 2021-02-20 DIAGNOSIS — J4521 Mild intermittent asthma with (acute) exacerbation: Secondary | ICD-10-CM

## 2021-02-20 MED ORDER — PREDNISONE 10 MG (21) PO TBPK: ORAL_TABLET | Freq: Every day | ORAL | 0 refills | Status: DC

## 2021-02-20 NOTE — ED Triage Notes (Signed)
Pt c/o cough, dyspnea, headache, body aches, light headedness, fatigue, body chills, and diarrhea. Denies N&V. States has tried home nebulizer, antacid and aspirin at home without relief.

## 2021-02-20 NOTE — Discharge Instructions (Addendum)
You have been prescribed prednisone steroid to help with asthma exacerbation.  Please increase clear oral fluid intake to prevent dehydration.  Your COVID test is pending.  We will call if it is positive.

## 2021-02-20 NOTE — ED Provider Notes (Signed)
[facility]    CSN: [id number] Arrival date & time: 02/20/21  1803      History   Chief Complaint Chief Complaint  Patient presents with   Cough    HPI Kristina Washington is a 60 y.o. female.   Patient presents with 4 to 5-day history of cough, shortness of breath, headache, body aches, fatigue, chills, diarrhea.  Diarrhea has now resolved.  Denies any nausea, vomiting, abdominal pain, chest pain.  Patient does have history of asthma and has been using albuterol inhaler and nebulizer with some relief.  Denies any known fevers or sick contacts.  Shortness of breath was worse when symptoms first started but is now only present with exertion.   Cough  Past Medical History:  Diagnosis Date   Arthritis    Bronchial asthma    Bronchitis    Cataracts, bilateral    Complication of anesthesia    slow to wake up   Headache    Heart murmur    mild no cardiologist Dr. Maudie Mercury    Hypercholesterolemia    Hypertension    Menorrhagia    MVP (mitral valve prolapse)    Pre-diabetes    pt. states pre Dm took metformin for a brief time as part of a weight loss program .no longer takes metformin Denies DM    Patient Active Problem List   Diagnosis Date Noted   Primary localized osteoarthritis of right knee 08/20/2019   Hypertension 01/26/2019   Hypercholesteremia 01/26/2019   Obesity, morbid ([place]) 03/10/2013   Fibroid uterus 09/16/2012   Bronchial asthma     Past Surgical History:  Procedure Laterality Date   CARPAL TUNNEL RELEASE     left wrist   ENDOMETRIAL ABLATION  03/23/10   FOOT SURGERY     BOTH RIGHT AND LEFT   KNEE ARTHROSCOPY  2002   right knee   TOTAL KNEE ARTHROPLASTY Right 08/20/2019   Procedure: TOTAL KNEE ARTHROPLASTY;  Surgeon: Earlie Server, MD;  Location: [facility];  Service: Orthopedics;  Laterality: Right;   TUBAL LIGATION      OB History     Gravida  2   Para  2   Term      Preterm      AB      Living  2      SAB      IAB       Ectopic      Multiple      Live Births               Home Medications    Prior to Admission medications   Medication Sig Start Date End Date Taking? Authorizing Provider  predniSONE (STERAPRED UNI-PAK 21 TAB) 10 MG (21) TBPK tablet Take by mouth daily. Take 6 tabs by mouth daily  for 2 days, then 5 tabs for 2 days, then 4 tabs for 2 days, then 3 tabs for 2 days, 2 tabs for 2 days, then 1 tab by mouth daily for 2 days 02/20/21  Yes Odis Luster, FNP  albuterol (PROVENTIL HFA;VENTOLIN HFA) 108 (90 Base) MCG/ACT inhaler Inhale 2 puffs into the lungs every 4 (four) hours as needed for wheezing. 08/06/15   Montine Circle, PA-C  albuterol (PROVENTIL) (2.5 MG/3ML) 0.083% nebulizer solution Take 2.5 mg by nebulization every 6 (six) hours as needed for wheezing or shortness of breath.    [provider]  amoxicillin-clavulanate (AUGMENTIN) 875-125 MG tablet Take 1 tablet by mouth 2 (two) times  daily. 07/25/20   Laurin Coder, MD  Ascorbic Acid (VITAMIN C PO) Take 1 tablet by mouth daily.    [provider]  Cholecalciferol (VITAMIN D3) 50 MCG (2000 UT) TABS Take 2,000 Units by mouth daily.    [provider]  Cyanocobalamin (VITAMIN B 12 PO) Take by mouth.    [provider]  cyclobenzaprine (FLEXERIL) 10 MG tablet Take 1 tablet (10 mg total) by mouth 3 (three) times daily as needed for muscle spasms. 05/24/16   Street, Mercedes, PA-C  fluticasone furoate-vilanterol (BREO ELLIPTA) 200-25 MCG/INH AEPB Inhale 1 puff into the lungs daily. 08/06/15   Montine Circle, PA-C  gabapentin (NEURONTIN) 300 MG capsule Take 1 capsule (300 mg total) by mouth 3 (three) times daily for 14 days, THEN 1 capsule (300 mg total) 2 (two) times daily for 3 days, THEN 1 capsule (300 mg total) daily for 4 days. 08/20/19 09/10/19  Chadwell, Vonna Kotyk, PA-C  hydrochlorothiazide (HYDRODIURIL) 25 MG tablet Take 25 mg by mouth daily. 04/15/19   [provider]  ketoconazole (NIZORAL) 2  % cream Apply topically daily. 10/19/19   [provider]  losartan (COZAAR) 100 MG tablet Take 100 mg by mouth daily. 04/15/19   [provider]  montelukast (SINGULAIR) 10 MG tablet Take 1 tablet (10 mg total) by mouth daily. Patient taking differently: Take 10 mg by mouth at bedtime. 08/06/15   Montine Circle, PA-C  Multiple Vitamin (MULTIVITAMIN WITH MINERALS) TABS tablet Take 1 tablet by mouth daily.    [provider]  oxyCODONE (OXY IR/ROXICODONE) 5 MG immediate release tablet Take one tab po q4-6hrs prn pain, may need 1-2 first couple weeks 08/20/19   Chadwell, Joshua, PA-C  Phentermine-Topiramate (QSYMIA) 7.5-46 MG CP24 Take 1 capsule by mouth daily before breakfast.    [provider]  simvastatin (ZOCOR) 10 MG tablet Take 10 mg by mouth at bedtime. 06/21/19   [provider]    Family History Family History  Problem Relation Age of Onset   Hypertension Father    Cancer Father        COLON   Diabetes Mother    Hypertension Mother    Cancer Mother    Diabetes Sister    Hypertension Sister    Diabetes Paternal Aunt    Diabetes Paternal Uncle     Social History Social History   Tobacco Use   Smoking status: Never   Smokeless tobacco: Never  Vaping Use   Vaping Use: Never used  Substance Use Topics   Alcohol use: Not Currently   Drug use: No     Allergies   Patient has no known allergies.   Review of Systems Review of Systems Per HPI  Physical Exam Triage Vital Signs ED Triage Vitals  Enc Vitals Group     BP 02/20/21 1816 120/80     Pulse Rate 02/20/21 1816 98     Resp 02/20/21 1816 18     Temp 02/20/21 1816 98.2 F (36.8 C)     Temp Source 02/20/21 1816 Oral     SpO2 02/20/21 1816 96 %     Weight --      Height --      Head Circumference --      Peak Flow --      Pain Score 02/20/21 1817 4     Pain Loc --      Pain Edu? --      Excl. in GC? --    No  data found.  Updated Vital Signs BP 120/80 (BP  Location: Left Arm)   Pulse (!) 110   Temp 98.2 F (36.8 C) (Oral)   Resp 18   LMP 03/01/2018 (Within Weeks)   SpO2 96% Comment: with deep breathing  Visual Acuity Right Eye Distance:   Left Eye Distance:   Bilateral Distance:    Right Eye Near:   Left Eye Near:    Bilateral Near:     Physical Exam Constitutional:      General: She is not in acute distress.    Appearance: Normal appearance.  HENT:     Head: Normocephalic and atraumatic.     Right Ear: Tympanic membrane and ear canal normal.     Left Ear: Tympanic membrane and ear canal normal.     Nose: Congestion present.     Mouth/Throat:     Mouth: Mucous membranes are moist.     Pharynx: No posterior oropharyngeal erythema.  Eyes:     Extraocular Movements: Extraocular movements intact.     Conjunctiva/sclera: Conjunctivae normal.     Pupils: Pupils are equal, round, and reactive to light.  Cardiovascular:     Rate and Rhythm: Normal rate and regular rhythm.     Pulses: Normal pulses.     Heart sounds: Normal heart sounds.  Pulmonary:     Effort: Pulmonary effort is normal. No respiratory distress.     Breath sounds: Normal breath sounds. No wheezing.  Abdominal:     General: Abdomen is flat. Bowel sounds are normal. There is no distension.     Palpations: Abdomen is soft.     Tenderness: There is no abdominal tenderness.  Musculoskeletal:        General: Normal range of motion.     Cervical back: Normal range of motion.  Skin:    General: Skin is warm and dry.  Neurological:     General: No focal deficit present.     Mental Status: She is alert and oriented to person, place, and time. Mental status is at baseline.  Psychiatric:        Mood and Affect: Mood normal.        Behavior: Behavior normal.     UC Treatments / Results  Labs (all labs ordered are listed, but only abnormal results are displayed) Labs Reviewed  NOVEL CORONAVIRUS, NAA    EKG   Radiology No results  found.  Procedures Procedures (including critical care time)  Medications Ordered in UC Medications - No data to display  Initial Impression / Assessment and Plan / UC Course  I have reviewed the triage vital signs and the nursing notes.  Pertinent labs & imaging results that were available during my care of the patient were reviewed by me and considered in my medical decision making (see chart for details).     Physical exam is consistent with viral upper respiratory infection that could be causing an asthma exacerbation.  Will prescribe prednisone steroid taper to decrease inflammation in chest and help alleviate her asthma exacerbation.  Patient to continue albuterol as needed.  Covid 19 viral swab is pending.  Discussed over-the-counter medications to help alleviate symptoms with patient.  Advised patient to go to the hospital if shortness of breath worsens.  Patient is nontoxic-appearing and does not appear to be in need of immediate medical attention at the hospital at this time. Discussed strict return precautions. Patient verbalized understanding and is agreeable with plan.  Final Clinical Impressions(s) / UC Diagnoses  Final diagnoses:  Intermittent asthma with acute exacerbation, unspecified asthma severity  Diarrhea, unspecified type  Cough     Discharge Instructions      You have been prescribed prednisone steroid to help with asthma exacerbation.  Please increase clear oral fluid intake to prevent dehydration.  Your COVID test is pending.  We will call if it is positive.     ED Prescriptions     Medication Sig Dispense Auth. Provider   predniSONE (STERAPRED UNI-PAK 21 TAB) 10 MG (21) TBPK tablet Take by mouth daily. Take 6 tabs by mouth daily  for 2 days, then 5 tabs for 2 days, then 4 tabs for 2 days, then 3 tabs for 2 days, 2 tabs for 2 days, then 1 tab by mouth daily for 2 days 42 tablet Odis Luster, FNP      PDMP not reviewed this encounter.   Odis Luster, FNP 02/20/21 1914

## 2021-02-21 LAB — SARS-COV-2, NAA 2 DAY TAT

## 2021-02-21 LAB — NOVEL CORONAVIRUS, NAA: SARS-CoV-2, NAA: NOT DETECTED

## 2021-03-29 ENCOUNTER — Other Ambulatory Visit: Payer: Self-pay | Admitting: Family Medicine

## 2021-03-29 DIAGNOSIS — Z1231 Encounter for screening mammogram for malignant neoplasm of breast: Secondary | ICD-10-CM

## 2021-04-21 ENCOUNTER — Ambulatory Visit
Admission: RE | Admit: 2021-04-21 | Discharge: 2021-04-21 | Disposition: A | Discharge status: Home / Self Care | Payer: Managed Care, Other (non HMO) | Source: Ambulatory Visit | Attending: Family Medicine | Admitting: Family Medicine

## 2021-04-21 DIAGNOSIS — Z1231 Encounter for screening mammogram for malignant neoplasm of breast: Secondary | ICD-10-CM

## 2021-04-25 ENCOUNTER — Ambulatory Visit: Payer: Managed Care, Other (non HMO)

## 2021-06-11 ENCOUNTER — Other Ambulatory Visit: Payer: Self-pay

## 2021-06-11 ENCOUNTER — Encounter (HOSPITAL_BASED_OUTPATIENT_CLINIC_OR_DEPARTMENT_OTHER): Payer: Self-pay | Admitting: Emergency Medicine

## 2021-06-11 ENCOUNTER — Emergency Department (HOSPITAL_BASED_OUTPATIENT_CLINIC_OR_DEPARTMENT_OTHER)
Admission: EM | Admit: 2021-06-11 | Discharge: 2021-06-11 | Disposition: A | Discharge status: Home / Self Care | Payer: Managed Care, Other (non HMO) | Attending: Emergency Medicine | Admitting: Emergency Medicine

## 2021-06-11 ENCOUNTER — Emergency Department (HOSPITAL_BASED_OUTPATIENT_CLINIC_OR_DEPARTMENT_OTHER): Payer: Managed Care, Other (non HMO) | Admitting: Radiology

## 2021-06-11 DIAGNOSIS — Z79899 Other long term (current) drug therapy: Secondary | ICD-10-CM | POA: Diagnosis not present

## 2021-06-11 DIAGNOSIS — Z7952 Long term (current) use of systemic steroids: Secondary | ICD-10-CM | POA: Diagnosis not present

## 2021-06-11 DIAGNOSIS — I10 Essential (primary) hypertension: Secondary | ICD-10-CM | POA: Insufficient documentation

## 2021-06-11 DIAGNOSIS — J45909 Unspecified asthma, uncomplicated: Secondary | ICD-10-CM | POA: Insufficient documentation

## 2021-06-11 DIAGNOSIS — Z96651 Presence of right artificial knee joint: Secondary | ICD-10-CM | POA: Insufficient documentation

## 2021-06-11 DIAGNOSIS — U071 COVID-19: Secondary | ICD-10-CM | POA: Insufficient documentation

## 2021-06-11 DIAGNOSIS — R0602 Shortness of breath: Secondary | ICD-10-CM | POA: Diagnosis present

## 2021-06-11 MED ORDER — IPRATROPIUM-ALBUTEROL 0.5-2.5 (3) MG/3ML IN SOLN: 3.0000 mL | Freq: Once | RESPIRATORY_TRACT | Status: DC

## 2021-06-11 NOTE — ED Triage Notes (Signed)
COVID+ x 1 week. C/o SOB with exertion. Hx of asthma. Using inhaler with relief.

## 2021-06-11 NOTE — Discharge Instructions (Addendum)
Your chest x-ray today was negative for pneumonia of infection.  EKG was also reassuring.  Please continue to quarantine at home and monitor your symptoms closely. You chest x-ray was clear. Antibiotics are not helpful in treating viral infection, the virus should run its course in about 5-7 days. Please make sure you are drinking plenty of fluids. You can treat your symptoms supportively with tylenol for fevers and pains, and over the counter cough syrups and throat lozenges to help with cough. If your symptoms are not improving please follow up with you Primary doctor.   I recommend that you purchase a home pulse ox to help better monitor your oxygen at home, if you start to have increased work of breathing or shortness of breath or your oxygen drops below 90% please immediately return to the hospital for reevaluation.  If you develop persistent fevers, shortness of breath or difficulty breathing, chest pain, severe headache and neck pain, persistent nausea and vomiting or other new or concerning symptoms return to the Emergency department.

## 2021-06-11 NOTE — ED Provider Notes (Signed)
[facility] EMERGENCY DEPT Provider Note   CSN: [id number] Arrival date & time: 06/11/21  1041     History Chief Complaint  Patient presents with   Shortness of Breath    Karlisa Gaubert is a 60 y.o. female who presents for evaluation of shortness of breath in the setting of asthma and positive COVID home test.  Patient has not really been having any fevers or myalgias, however about 1 week ago she noticed increased shortness of breath that she attributed to asthma exacerbation from the cold weather.  Her son tested positive for COVID yesterday which made her take a test where she also tested positive.  She has been using her at home albuterol and nebulizer with relief.  She denies fever, chills, abdominal pain, nausea, vomiting, diarrhea.   Shortness of Breath Associated symptoms: cough   Associated symptoms: no abdominal pain, no fever, no headaches, no rash and no vomiting       Past Medical History:  Diagnosis Date   Arthritis    Bronchial asthma    Bronchitis    Cataracts, bilateral    Complication of anesthesia    slow to wake up   Headache    Heart murmur    mild no cardiologist Dr. Maudie Mercury    Hypercholesterolemia    Hypertension    Menorrhagia    MVP (mitral valve prolapse)    Pre-diabetes    pt. states pre Dm took metformin for a brief time as part of a weight loss program .no longer takes metformin Denies DM    Patient Active Problem List   Diagnosis Date Noted   Primary localized osteoarthritis of right knee 08/20/2019   Hypertension 01/26/2019   Hypercholesteremia 01/26/2019   Obesity, morbid ([place]) 03/10/2013   Fibroid uterus 09/16/2012   Bronchial asthma     Past Surgical History:  Procedure Laterality Date   CARPAL TUNNEL RELEASE     left wrist   ENDOMETRIAL ABLATION  03/23/10   FOOT SURGERY     BOTH RIGHT AND LEFT   KNEE ARTHROSCOPY  2002   right knee   TOTAL KNEE ARTHROPLASTY Right 08/20/2019   Procedure: TOTAL KNEE ARTHROPLASTY;   Surgeon: Earlie Server, MD;  Location: [facility];  Service: Orthopedics;  Laterality: Right;   TUBAL LIGATION       OB History     Gravida  2   Para  2   Term      Preterm      AB      Living  2      SAB      IAB      Ectopic      Multiple      Live Births              Family History  Problem Relation Age of Onset   Hypertension Father    Cancer Father        COLON   Diabetes Mother    Hypertension Mother    Cancer Mother    Diabetes Sister    Hypertension Sister    Diabetes Paternal Aunt    Diabetes Paternal Uncle     Social History   Tobacco Use   Smoking status: Never   Smokeless tobacco: Never  Vaping Use   Vaping Use: Never used  Substance Use Topics   Alcohol use: Not Currently   Drug use: No    Home Medications Prior to Admission medications   Medication Sig Start Date  End Date Taking? Authorizing Provider  albuterol (PROVENTIL HFA;VENTOLIN HFA) 108 (90 Base) MCG/ACT inhaler Inhale 2 puffs into the lungs every 4 (four) hours as needed for wheezing. 08/06/15   Montine Circle, PA-C  albuterol (PROVENTIL) (2.5 MG/3ML) 0.083% nebulizer solution Take 2.5 mg by nebulization every 6 (six) hours as needed for wheezing or shortness of breath.    [provider]  amoxicillin-clavulanate (AUGMENTIN) 875-125 MG tablet Take 1 tablet by mouth 2 (two) times daily. 07/25/20   Laurin Coder, MD  Ascorbic Acid (VITAMIN C PO) Take 1 tablet by mouth daily.    [provider]  Cholecalciferol (VITAMIN D3) 50 MCG (2000 UT) TABS Take 2,000 Units by mouth daily.    [provider]  Cyanocobalamin (VITAMIN B 12 PO) Take by mouth.    [provider]  cyclobenzaprine (FLEXERIL) 10 MG tablet Take 1 tablet (10 mg total) by mouth 3 (three) times daily as needed for muscle spasms. 05/24/16   Street, Mercedes, PA-C  fluticasone furoate-vilanterol (BREO ELLIPTA) 200-25 MCG/INH AEPB Inhale 1 puff into the lungs daily. 08/06/15    Montine Circle, PA-C  gabapentin (NEURONTIN) 300 MG capsule Take 1 capsule (300 mg total) by mouth 3 (three) times daily for 14 days, THEN 1 capsule (300 mg total) 2 (two) times daily for 3 days, THEN 1 capsule (300 mg total) daily for 4 days. 08/20/19 09/10/19  Chadwell, Vonna Kotyk, PA-C  hydrochlorothiazide (HYDRODIURIL) 25 MG tablet Take 25 mg by mouth daily. 04/15/19   [provider]  ketoconazole (NIZORAL) 2 % cream Apply topically daily. 10/19/19   [provider]  losartan (COZAAR) 100 MG tablet Take 100 mg by mouth daily. 04/15/19   [provider]  montelukast (SINGULAIR) 10 MG tablet Take 1 tablet (10 mg total) by mouth daily. Patient taking differently: Take 10 mg by mouth at bedtime. 08/06/15   Montine Circle, PA-C  Multiple Vitamin (MULTIVITAMIN WITH MINERALS) TABS tablet Take 1 tablet by mouth daily.    [provider]  oxyCODONE (OXY IR/ROXICODONE) 5 MG immediate release tablet Take one tab po q4-6hrs prn pain, may need 1-2 first couple weeks 08/20/19   Chadwell, Joshua, PA-C  Phentermine-Topiramate (QSYMIA) 7.5-46 MG CP24 Take 1 capsule by mouth daily before breakfast.    [provider]  predniSONE (STERAPRED UNI-PAK 21 TAB) 10 MG (21) TBPK tablet Take by mouth daily. Take 6 tabs by mouth daily  for 2 days, then 5 tabs for 2 days, then 4 tabs for 2 days, then 3 tabs for 2 days, 2 tabs for 2 days, then 1 tab by mouth daily for 2 days 02/20/21   Teodora Medici, FNP  simvastatin (ZOCOR) 10 MG tablet Take 10 mg by mouth at bedtime. 06/21/19   [provider]    Allergies    Patient has no known allergies.  Review of Systems   Review of Systems  Constitutional:  Negative for fever.  HENT: Negative.    Eyes: Negative.   Respiratory:  Positive for cough and shortness of breath.   Cardiovascular: Negative.   Gastrointestinal:  Negative for abdominal pain and vomiting.  Endocrine: Negative.   Genitourinary: Negative.   Musculoskeletal:  Negative.   Skin:  Negative for rash.  Neurological:  Negative for headaches.  All other systems reviewed and are negative.  Physical Exam Updated Vital Signs BP (!) 148/89 (BP Location: Right Arm)    Pulse 79    Temp 98.8 F (37.1 C) (Oral)    Resp  20    Ht 5\' 10"  (1.778 m)    Wt 121.6 kg    LMP 03/01/2018 (Within Weeks)    SpO2 99%    BMI 38.45 kg/m   Physical Exam Vitals and nursing note reviewed.  Constitutional:      General: She is not in acute distress.    Appearance: She is not ill-appearing.  HENT:     Head: Atraumatic.     Nose: Nose normal.  Eyes:     Extraocular Movements: Extraocular movements intact.     Conjunctiva/sclera: Conjunctivae normal.  Cardiovascular:     Rate and Rhythm: Normal rate and regular rhythm.     Pulses: Normal pulses.          Radial pulses are 2+ on the right side and 2+ on the left side.       Dorsalis pedis pulses are 2+ on the right side and 2+ on the left side.     Heart sounds: No murmur heard. Pulmonary:     Effort: Pulmonary effort is normal. No accessory muscle usage or respiratory distress.     Breath sounds: Normal breath sounds.     Comments: Lung clear to ausculation bilaterally. No tachypnea, no accessory muscle use, no acute distress, no increased work of breathing, no decrease in air movement  Abdominal:     General: Abdomen is flat. There is no distension.     Palpations: Abdomen is soft.     Tenderness: There is no abdominal tenderness.  Musculoskeletal:        General: Normal range of motion.     Cervical back: Normal range of motion.  Skin:    General: Skin is warm and dry.     Capillary Refill: Capillary refill takes less than 2 seconds.  Neurological:     General: No focal deficit present.     Mental Status: She is alert.  Psychiatric:        Mood and Affect: Mood normal.    ED Results / Procedures / Treatments   Labs (all labs ordered are listed, but only abnormal results are displayed) Labs Reviewed - No  data to display  EKG None  Radiology DG Chest 2 View  Result Date: 06/11/2021 CLINICAL DATA:  Shortness of breath EXAM: CHEST - 2 VIEW COMPARISON:  06/29/2018 FINDINGS: The heart size and mediastinal contours are within normal limits. No focal airspace consolidation, pleural effusion, or pneumothorax. The visualized skeletal structures are unremarkable. IMPRESSION: No active cardiopulmonary disease. Electronically Signed   By: Davina Poke D.O.   On: 06/11/2021 11:30    Procedures Procedures   Medications Ordered in ED Medications  ipratropium-albuterol (DUONEB) 0.5-2.5 (3) MG/3ML nebulizer solution 3 mL (3 mLs Nebulization Not Given 06/11/21 1334)    ED Course  I have reviewed the triage vital signs and the nursing notes.  Pertinent labs & imaging results that were available during my care of the patient were reviewed by me and considered in my medical decision making (see chart for details).    MDM Rules/Calculators/A&P                          60 y.o. female presents with 7 days of cough and shortness of breath.  Positive home test yesterday morning. fortunately patient is overall well-appearing, Vitals WNL. Patient with no hypoxia or increased work of breathing at rest or with activity.   Chest x-ray reviewed and is clear without signs of COVID-pneumonia  at this time, no other active cardiopulmonary disease noted.  EKG reassuring. Suspicion  Patient with COVID infection today but overall symptoms appear mild and evaluation has been very reassuring today. No criteria for admission at this time. Discussed appropriate quarantine at home as well as continued symptomatic treatment. Encourage patient to purchase pulse ox for monitoring of O2 sats at home and discussed strict return precaution. Provided information for post-COVID care clinic as well. Strict return precautions discussed. Patient expresses understanding and agreement. Discharged home in good condition.  Trany Chernick was evaluated in Emergency Department on 06/11/2021 for the symptoms described in the history of present illness. She was evaluated in the context of the global COVID-19 pandemic, which necessitated consideration that the patient might be at risk for infection with the SARS-CoV-2 virus that causes COVID-19. Institutional protocols and algorithms that pertain to the evaluation of patients at risk for COVID-19 are in a state of rapid change based on information released by regulatory bodies including the CDC and federal and state organizations. These policies and algorithms were followed during the patient's care in the ED.    Final Clinical Impression(s) / ED Diagnoses Final diagnoses:  [id number]    Rx / DC Orders ED Discharge Orders          Ordered    For home use only DME Nebulizer machine        06/11/21 1332             Tonye Pearson, [place] 06/11/21 1342    Blanchie Dessert, MD 06/14/21 1315

## 2021-10-25 ENCOUNTER — Encounter: Payer: Self-pay | Admitting: Podiatry

## 2021-10-25 ENCOUNTER — Ambulatory Visit (INDEPENDENT_AMBULATORY_CARE_PROVIDER_SITE_OTHER): Payer: Managed Care, Other (non HMO)

## 2021-10-25 ENCOUNTER — Ambulatory Visit: Payer: Managed Care, Other (non HMO) | Admitting: Podiatry

## 2021-10-25 DIAGNOSIS — M779 Enthesopathy, unspecified: Secondary | ICD-10-CM

## 2021-10-25 DIAGNOSIS — M7672 Peroneal tendinitis, left leg: Secondary | ICD-10-CM

## 2021-10-25 MED ORDER — TRIAMCINOLONE ACETONIDE 10 MG/ML IJ SUSP
10.0000 mg | Freq: Once | INTRAMUSCULAR | Status: AC
  Administered 2021-10-25: 10 mg

## 2021-10-25 NOTE — Progress Notes (Signed)
Subjective:  ? ?Patient ID: Kristina Washington, female   DOB: 61 y.o.   MRN: [id number]  ? ?HPI ?Patient presents stating she is getting a lot of pain in the outside of her left foot and she bumped her foot a few months ago and it seemed to occur after that ? ? ?ROS ? ? ?   ?Objective:  ?Physical Exam  ?Neurovascular status intact with pain in the outside of the left fifth metatarsal base with fluid buildup no indication of tendon dysfunction currently ? ?   ?Assessment:  ?Probability for acute peroneal tendinitis left base of fifth metatarsal insertion ? ?   ?Plan:  ?H&P x-rays reviewed condition explained and I went ahead today discussed sheath injection at the insertion explaining chances for rupture and she wants injection.  Sterile prep injected the sheath 3 mg dexamethasone Kenalog 5 mg Xylocaine at insertion and then applied fascial brace to lift up the lateral side of the foot advised on ice therapy shoe gear modifications ? ?X-rays indicate that there is some stress on the base of the fifth metatarsal left slight reactivity no indication of fracture arthritis ?   ? ? ?

## 2021-12-21 ENCOUNTER — Other Ambulatory Visit: Payer: Self-pay | Admitting: Obstetrics & Gynecology

## 2022-02-26 ENCOUNTER — Other Ambulatory Visit: Payer: Self-pay | Admitting: Family Medicine

## 2022-02-26 DIAGNOSIS — Z1231 Encounter for screening mammogram for malignant neoplasm of breast: Secondary | ICD-10-CM

## 2022-03-05 ENCOUNTER — Encounter: Payer: Self-pay | Admitting: Pulmonary Disease

## 2022-03-05 ENCOUNTER — Ambulatory Visit (INDEPENDENT_AMBULATORY_CARE_PROVIDER_SITE_OTHER): Payer: Managed Care, Other (non HMO) | Admitting: Pulmonary Disease

## 2022-03-05 VITALS — BP 110/70 | HR 99 | Temp 98.5°F | Ht 70.0 in | Wt 283.0 lb

## 2022-03-05 DIAGNOSIS — Z23 Encounter for immunization: Secondary | ICD-10-CM

## 2022-03-05 DIAGNOSIS — J45909 Unspecified asthma, uncomplicated: Secondary | ICD-10-CM

## 2022-03-05 MED ORDER — FLUTICASONE FUROATE-VILANTEROL 200-25 MCG/ACT IN AEPB: 1.0000 | INHALATION_SPRAY | Freq: Every day | RESPIRATORY_TRACT | 11 refills | Status: DC

## 2022-03-05 NOTE — Progress Notes (Signed)
Kristina Washington    [id number]    1960-07-22  Primary Care Physician:Collins, Hinton Dyer, DO  Referring Physician: Janie Morning, [address] [place] [place],  [place] [address]  Chief complaint:  History of asthma  HPI:  She has been relatively well with no recent exacerbation She still uses albuterol occasionally at night -This she relates to possibly some weight gain  Uses Breo daily  Has not had any significant exacerbation of her asthma  There has been no recent change in work or home environment to account for the exacerbation of symptoms Has had bronchitis many times in the past  Never smoker   Outpatient Encounter Medications as of 03/05/2022  Medication Sig   albuterol (PROVENTIL HFA;VENTOLIN HFA) 108 (90 Base) MCG/ACT inhaler Inhale 2 puffs into the lungs every 4 (four) hours as needed for wheezing.   albuterol (PROVENTIL) (2.5 MG/3ML) 0.083% nebulizer solution Take 2.5 mg by nebulization every 6 (six) hours as needed for wheezing or shortness of breath.   Ascorbic Acid (VITAMIN C PO) Take 1 tablet by mouth daily.   Cholecalciferol (VITAMIN D3) 50 MCG (2000 UT) TABS Take 2,000 Units by mouth daily.   Cyanocobalamin (VITAMIN B 12 PO) Take by mouth.   cyclobenzaprine (FLEXERIL) 10 MG tablet Take 1 tablet (10 mg total) by mouth 3 (three) times daily as needed for muscle spasms.   fluticasone furoate-vilanterol (BREO ELLIPTA) 200-25 MCG/INH AEPB Inhale 1 puff into the lungs daily.   hydrochlorothiazide (HYDRODIURIL) 25 MG tablet Take 25 mg by mouth daily.   ketoconazole (NIZORAL) 2 % cream Apply topically daily.   losartan (COZAAR) 100 MG tablet Take 100 mg by mouth daily.   montelukast (SINGULAIR) 10 MG tablet Take 1 tablet (10 mg total) by mouth daily. (Patient taking differently: Take 10 mg by mouth at bedtime.)   Multiple Vitamin (MULTIVITAMIN WITH MINERALS) TABS tablet Take 1 tablet by mouth daily.   predniSONE (STERAPRED UNI-PAK 21 TAB) 10 MG (21)  TBPK tablet Take by mouth daily. Take 6 tabs by mouth daily  for 2 days, then 5 tabs for 2 days, then 4 tabs for 2 days, then 3 tabs for 2 days, 2 tabs for 2 days, then 1 tab by mouth daily for 2 days   [DISCONTINUED] simvastatin (ZOCOR) 10 MG tablet Take 10 mg by mouth at bedtime.   rosuvastatin (CRESTOR) 10 MG tablet Take 10 mg by mouth daily.   [DISCONTINUED] amoxicillin-clavulanate (AUGMENTIN) 875-125 MG tablet Take 1 tablet by mouth 2 (two) times daily. (Patient not taking: Reported on 03/05/2022)   [DISCONTINUED] gabapentin (NEURONTIN) 300 MG capsule Take 1 capsule (300 mg total) by mouth 3 (three) times daily for 14 days, THEN 1 capsule (300 mg total) 2 (two) times daily for 3 days, THEN 1 capsule (300 mg total) daily for 4 days.   No facility-administered encounter medications on file as of 03/05/2022.    Allergies as of 03/05/2022   (No Known Allergies)    Past Medical History:  Diagnosis Date   Arthritis    Bronchial asthma    Bronchitis    Cataracts, bilateral    Complication of anesthesia    slow to wake up   Headache    Heart murmur    mild no cardiologist Dr. Maudie Mercury    Hypercholesterolemia    Hypertension    Menorrhagia    MVP (mitral valve prolapse)    Pre-diabetes    pt. states pre Dm took metformin  for a brief time as part of a weight loss program .no longer takes metformin Denies DM    Past Surgical History:  Procedure Laterality Date   CARPAL TUNNEL RELEASE     left wrist   ENDOMETRIAL ABLATION  03/23/10   FOOT SURGERY     BOTH RIGHT AND LEFT   KNEE ARTHROSCOPY  2002   right knee   TOTAL KNEE ARTHROPLASTY Right 08/20/2019   Procedure: TOTAL KNEE ARTHROPLASTY;  Surgeon: Earlie Server, MD;  Location: [facility];  Service: Orthopedics;  Laterality: Right;   TUBAL LIGATION      Family History  Problem Relation Age of Onset   Hypertension Father    Cancer Father        COLON   Diabetes Mother    Hypertension Mother    Cancer Mother    Diabetes Sister     Hypertension Sister    Diabetes Paternal Aunt    Diabetes Paternal Uncle     Social History   Socioeconomic History   Marital status: Divorced    Spouse name: Not on file   Number of children: Not on file   Years of education: Not on file   Highest education level: Not on file  Occupational History   Not on file  Tobacco Use   Smoking status: Never   Smokeless tobacco: Never  Vaping Use   Vaping Use: Never used  Substance and Sexual Activity   Alcohol use: Not Currently   Drug use: No   Sexual activity: Not Currently    Birth control/protection: Condom    Comment: intercourse age 66, more than 41 sexual partners, des neg  Other Topics Concern   Not on file  Social History Narrative   Not on file   Social Determinants of Health   Financial Resource Strain: Not on file  Food Insecurity: Not on file  Transportation Needs: Not on file  Physical Activity: Not on file  Stress: Not on file  Social Connections: Not on file  Intimate Partner Violence: Not on file    Review of Systems  Constitutional: Negative.  Negative for fatigue.  HENT: Negative.    Eyes: Negative.   Respiratory:  Negative for shortness of breath.   Cardiovascular: Negative.   Gastrointestinal: Negative.   Endocrine: Negative.   Genitourinary: Negative.     Vitals:   03/05/22 1524  BP: 110/70  Pulse: 99  Temp: 98.5 F (36.9 C)  SpO2: 98%     Physical Exam Constitutional:      Appearance: She is well-developed. She is obese. She is not diaphoretic.  HENT:     Head: Normocephalic.     Mouth/Throat:     Mouth: Mucous membranes are moist.  Eyes:     Pupils: Pupils are equal, round, and reactive to light.  Neck:     Thyroid: No thyromegaly.     Trachea: No tracheal deviation.  Cardiovascular:     Rate and Rhythm: Normal rate and regular rhythm.     Heart sounds: No murmur heard.    No friction rub.  Pulmonary:     Effort: Pulmonary effort is normal. No respiratory distress.      Breath sounds: No stridor. No wheezing or rhonchi.  Musculoskeletal:     Cervical back: Normal range of motion and neck supple.  Neurological:     Mental Status: She is alert.  Psychiatric:        Mood and Affect: Mood normal.    Data Reviewed:  Last chest x-ray was June 11, 2021-no acute infiltrate  Assessment:  Asthma appears adequately controlled  Will continue Breo  Graded activities as tolerated   Plan/Recommendations:  Continue Breo  Continue albuterol as needed  May vary the timing of Breo use to do a bit later in the day  Administer flu shot today  Encouraged to call with concerns  Follow-up in a year  Sherrilyn Rist MD  Pulmonary and Critical Care 03/05/2022, 3:25 PM  CC: Janie Morning, DO

## 2022-03-05 NOTE — Patient Instructions (Signed)
Continue Breo  Will see you in a year  Call with significant concerns

## 2022-04-22 ENCOUNTER — Ambulatory Visit
Admission: RE | Admit: 2022-04-22 | Discharge: 2022-04-22 | Disposition: A | Discharge status: Home / Self Care | Payer: Managed Care, Other (non HMO) | Source: Ambulatory Visit | Attending: Family Medicine | Admitting: Family Medicine

## 2022-04-22 DIAGNOSIS — Z1231 Encounter for screening mammogram for malignant neoplasm of breast: Secondary | ICD-10-CM

## 2022-04-23 ENCOUNTER — Ambulatory Visit (INDEPENDENT_AMBULATORY_CARE_PROVIDER_SITE_OTHER): Payer: Managed Care, Other (non HMO) | Admitting: Pulmonary Disease

## 2022-04-23 ENCOUNTER — Encounter: Payer: Self-pay | Admitting: Pulmonary Disease

## 2022-04-23 ENCOUNTER — Ambulatory Visit (INDEPENDENT_AMBULATORY_CARE_PROVIDER_SITE_OTHER): Payer: Managed Care, Other (non HMO)

## 2022-04-23 VITALS — BP 150/90 | HR 90 | Temp 97.9°F | Ht 70.0 in | Wt 294.2 lb

## 2022-04-23 DIAGNOSIS — R051 Acute cough: Secondary | ICD-10-CM | POA: Diagnosis not present

## 2022-04-23 MED ORDER — AMOXICILLIN-POT CLAVULANATE 875-125 MG PO TABS: 1.0000 | ORAL_TABLET | Freq: Two times a day (BID) | ORAL | 0 refills | Status: AC

## 2022-04-23 MED ORDER — PREDNISONE 20 MG PO TABS: 20.0000 mg | ORAL_TABLET | Freq: Every day | ORAL | 0 refills | Status: DC

## 2022-04-23 NOTE — Patient Instructions (Signed)
We will get a chest x-ray today  Prescription for Augmentin and prednisone sent to pharmacy for you  Call us if not feeling better despite changes in intervention  Continue using your inhalers  Tentative follow-up in about 4 weeks

## 2022-04-23 NOTE — Progress Notes (Signed)
Kristina Washington    [id number]    1960/11/01  Primary Care Physician:Collins, Hinton Dyer, DO  Referring Physician: Janie Morning, [address] [place],  [place] [id number]  Chief complaint:  History of asthma Symptom exacerbation since last office visit  HPI:  Increased shortness of breath, wheezing, cough No fevers, no chills More short of breath than usual, exercise limitation  Wheezes more at night  Continues to use Breo daily, has been using her nebulizer more recently  She does have a cough, largely nonproductive but bothersome enough to keep up at night sometimes  There has been no recent change in work or home environment to account for the exacerbation of symptoms Has had bronchitis many times in the past  Never smoker   Outpatient Encounter Medications as of 04/23/2022  Medication Sig   albuterol (PROVENTIL HFA;VENTOLIN HFA) 108 (90 Base) MCG/ACT inhaler Inhale 2 puffs into the lungs every 4 (four) hours as needed for wheezing.   albuterol (PROVENTIL) (2.5 MG/3ML) 0.083% nebulizer solution Take 2.5 mg by nebulization every 6 (six) hours as needed for wheezing or shortness of breath.   Ascorbic Acid (VITAMIN C PO) Take 1 tablet by mouth daily.   Cholecalciferol (VITAMIN D3) 50 MCG (2000 UT) TABS Take 2,000 Units by mouth daily.   Cyanocobalamin (VITAMIN B 12 PO) Take by mouth.   cyclobenzaprine (FLEXERIL) 10 MG tablet Take 1 tablet (10 mg total) by mouth 3 (three) times daily as needed for muscle spasms.   fluticasone furoate-vilanterol (BREO ELLIPTA) 200-25 MCG/ACT AEPB Inhale 1 puff into the lungs daily.   hydrochlorothiazide (HYDRODIURIL) 25 MG tablet Take 25 mg by mouth daily.   ibuprofen (ADVIL) 800 MG tablet Take 800 mg by mouth every 8 (eight) hours as needed.   ketoconazole (NIZORAL) 2 % cream Apply topically daily.   losartan (COZAAR) 100 MG tablet Take 100 mg by mouth daily.   montelukast (SINGULAIR) 10 MG tablet Take 1 tablet (10 mg  total) by mouth daily. (Patient taking differently: Take 10 mg by mouth at bedtime.)   Multiple Vitamin (MULTIVITAMIN WITH MINERALS) TABS tablet Take 1 tablet by mouth daily.   predniSONE (STERAPRED UNI-PAK 21 TAB) 10 MG (21) TBPK tablet Take by mouth daily. Take 6 tabs by mouth daily  for 2 days, then 5 tabs for 2 days, then 4 tabs for 2 days, then 3 tabs for 2 days, 2 tabs for 2 days, then 1 tab by mouth daily for 2 days   rosuvastatin (CRESTOR) 10 MG tablet Take 10 mg by mouth daily.   fluticasone furoate-vilanterol (BREO ELLIPTA) 200-25 MCG/INH AEPB Inhale 1 puff into the lungs daily. (Patient not taking: Reported on 04/23/2022)   No facility-administered encounter medications on file as of 04/23/2022.    Allergies as of 04/23/2022   (No Known Allergies)    Past Medical History:  Diagnosis Date   Arthritis    Bronchial asthma    Bronchitis    Cataracts, bilateral    Complication of anesthesia    slow to wake up   Headache    Heart murmur    mild no cardiologist Dr. Maudie Mercury    Hypercholesterolemia    Hypertension    Menorrhagia    MVP (mitral valve prolapse)    Pre-diabetes    pt. states pre Dm took metformin for a brief time as part of a weight loss program .no longer takes metformin Denies DM    Past Surgical History:  Procedure Laterality Date   CARPAL TUNNEL RELEASE     left wrist   ENDOMETRIAL ABLATION  03/23/10   FOOT SURGERY     BOTH RIGHT AND LEFT   KNEE ARTHROSCOPY  2002   right knee   TOTAL KNEE ARTHROPLASTY Right 08/20/2019   Procedure: TOTAL KNEE ARTHROPLASTY;  Surgeon: Earlie Server, MD;  Location: [facility];  Service: Orthopedics;  Laterality: Right;   TUBAL LIGATION      Family History  Problem Relation Age of Onset   Hypertension Father    Cancer Father        COLON   Diabetes Mother    Hypertension Mother    Cancer Mother    Diabetes Sister    Hypertension Sister    Diabetes Paternal Aunt    Diabetes Paternal Uncle     Social History    Socioeconomic History   Marital status: Divorced    Spouse name: Not on file   Number of children: Not on file   Years of education: Not on file   Highest education level: Not on file  Occupational History   Not on file  Tobacco Use   Smoking status: Never   Smokeless tobacco: Never  Vaping Use   Vaping Use: Never used  Substance and Sexual Activity   Alcohol use: Not Currently   Drug use: No   Sexual activity: Not Currently    Birth control/protection: Condom    Comment: intercourse age 83, more than 51 sexual partners, des neg  Other Topics Concern   Not on file  Social History Narrative   Not on file   Social Determinants of Health   Financial Resource Strain: Not on file  Food Insecurity: Not on file  Transportation Needs: Not on file  Physical Activity: Not on file  Stress: Not on file  Social Connections: Not on file  Intimate Partner Violence: Not on file    Review of Systems  Constitutional: Negative.  Negative for fatigue.  HENT: Negative.    Eyes: Negative.   Respiratory:  Positive for cough, shortness of breath and wheezing.   Cardiovascular: Negative.   Gastrointestinal: Negative.   Endocrine: Negative.   Genitourinary: Negative.     Vitals:   04/23/22 0928  BP: (!) 150/90  Pulse: 90  Temp: 97.9 F (36.6 C)  SpO2: 96%     Physical Exam Constitutional:      Appearance: She is well-developed. She is obese. She is not diaphoretic.  HENT:     Head: Normocephalic.     Mouth/Throat:     Mouth: Mucous membranes are moist.  Eyes:     Pupils: Pupils are equal, round, and reactive to light.  Neck:     Thyroid: No thyromegaly.     Trachea: No tracheal deviation.  Cardiovascular:     Rate and Rhythm: Normal rate and regular rhythm.     Heart sounds: No murmur heard.    No friction rub.  Pulmonary:     Effort: Pulmonary effort is normal. No respiratory distress.     Breath sounds: No stridor. No wheezing or rhonchi.  Musculoskeletal:      Cervical back: Normal range of motion and neck supple.  Neurological:     Mental Status: She is alert.  Psychiatric:        Mood and Affect: Mood normal.    Data Reviewed: Last chest x-ray was June 11, 2021-no acute infiltrate  Assessment:  Asthma with exacerbation of symptoms  We will  continue regular inhaler Breo 100  Graded activities as tolerated   Plan/Recommendations:  Landscape architect use as needed  Prescription for Augmentin  Prescription for prednisone for 7 days  Obtain a chest x-ray today   Follow-up in about 4 weeks  Encouraged to give [place] a call if any significant concerns  Sherrilyn Rist MD [facility] 04/23/2022, 9:36 AM  CC: Janie Morning, DO

## 2022-10-18 ENCOUNTER — Other Ambulatory Visit: Payer: Self-pay | Admitting: Obstetrics & Gynecology

## 2022-10-18 DIAGNOSIS — R102 Pelvic and perineal pain: Secondary | ICD-10-CM

## 2022-10-21 ENCOUNTER — Other Ambulatory Visit: Payer: Self-pay | Admitting: Orthopedic Surgery

## 2022-10-25 ENCOUNTER — Ambulatory Visit
Admission: RE | Admit: 2022-10-25 | Discharge: 2022-10-25 | Disposition: A | Discharge status: Home / Self Care | Payer: Managed Care, Other (non HMO) | Source: Ambulatory Visit | Attending: Obstetrics & Gynecology | Admitting: Obstetrics & Gynecology

## 2022-10-25 DIAGNOSIS — R102 Pelvic and perineal pain: Secondary | ICD-10-CM

## 2022-10-31 ENCOUNTER — Encounter (HOSPITAL_BASED_OUTPATIENT_CLINIC_OR_DEPARTMENT_OTHER): Payer: Self-pay | Admitting: Orthopedic Surgery

## 2022-10-31 ENCOUNTER — Other Ambulatory Visit: Payer: Self-pay

## 2022-11-04 ENCOUNTER — Encounter (HOSPITAL_BASED_OUTPATIENT_CLINIC_OR_DEPARTMENT_OTHER)
Admission: RE | Admit: 2022-11-04 | Discharge: 2022-11-04 | Disposition: A | Discharge status: Home / Self Care | Payer: Managed Care, Other (non HMO) | Source: Ambulatory Visit | Attending: Orthopedic Surgery | Admitting: Orthopedic Surgery

## 2022-11-04 DIAGNOSIS — I440 Atrioventricular block, first degree: Secondary | ICD-10-CM | POA: Insufficient documentation

## 2022-11-04 DIAGNOSIS — Z79899 Other long term (current) drug therapy: Secondary | ICD-10-CM | POA: Insufficient documentation

## 2022-11-04 DIAGNOSIS — I10 Essential (primary) hypertension: Secondary | ICD-10-CM | POA: Insufficient documentation

## 2022-11-04 DIAGNOSIS — Z01818 Encounter for other preprocedural examination: Secondary | ICD-10-CM | POA: Insufficient documentation

## 2022-11-04 LAB — BASIC METABOLIC PANEL
Anion gap: 9 (ref 5–15)
BUN: 13 mg/dL (ref 6–20)
CO2: 27 mmol/L (ref 22–32)
Calcium: 8.7 mg/dL — ABNORMAL LOW (ref 8.9–10.3)
Chloride: 102 mmol/L (ref 98–111)
Creatinine, Ser: 0.74 mg/dL (ref 0.44–1.00)
GFR, Estimated: >60 mL/min (ref >60)
Glucose, Bld: 105 mg/dL — ABNORMAL HIGH (ref 70–99)
Potassium: 3.5 mmol/L (ref 3.5–5.1)
Sodium: 138 mmol/L (ref 135–145)

## 2022-11-04 NOTE — Progress Notes (Signed)

## 2022-11-07 ENCOUNTER — Ambulatory Visit (HOSPITAL_BASED_OUTPATIENT_CLINIC_OR_DEPARTMENT_OTHER): Payer: Managed Care, Other (non HMO) | Admitting: Anesthesiology

## 2022-11-07 ENCOUNTER — Other Ambulatory Visit: Payer: Self-pay

## 2022-11-07 ENCOUNTER — Ambulatory Visit (HOSPITAL_BASED_OUTPATIENT_CLINIC_OR_DEPARTMENT_OTHER)
Admission: RE | Admit: 2022-11-07 | Discharge: 2022-11-07 | Disposition: A | Discharge status: Home / Self Care | Payer: Managed Care, Other (non HMO) | Attending: Orthopedic Surgery | Admitting: Orthopedic Surgery

## 2022-11-07 ENCOUNTER — Encounter (HOSPITAL_BASED_OUTPATIENT_CLINIC_OR_DEPARTMENT_OTHER): Payer: Self-pay | Admitting: Orthopedic Surgery

## 2022-11-07 ENCOUNTER — Encounter (HOSPITAL_BASED_OUTPATIENT_CLINIC_OR_DEPARTMENT_OTHER)
Admission: RE | Disposition: A | Discharge status: Home / Self Care | Payer: Self-pay | Source: Home / Self Care | Attending: Orthopedic Surgery

## 2022-11-07 DIAGNOSIS — M65331 Trigger finger, right middle finger: Secondary | ICD-10-CM | POA: Diagnosis present

## 2022-11-07 DIAGNOSIS — Z6841 Body Mass Index (BMI) 40.0 and over, adult: Secondary | ICD-10-CM | POA: Insufficient documentation

## 2022-11-07 DIAGNOSIS — J449 Chronic obstructive pulmonary disease, unspecified: Secondary | ICD-10-CM | POA: Diagnosis not present

## 2022-11-07 DIAGNOSIS — I10 Essential (primary) hypertension: Secondary | ICD-10-CM | POA: Insufficient documentation

## 2022-11-07 DIAGNOSIS — M65841 Other synovitis and tenosynovitis, right hand: Secondary | ICD-10-CM | POA: Insufficient documentation

## 2022-11-07 DIAGNOSIS — Z79899 Other long term (current) drug therapy: Secondary | ICD-10-CM

## 2022-11-07 DIAGNOSIS — Z01818 Encounter for other preprocedural examination: Secondary | ICD-10-CM

## 2022-11-07 HISTORY — PX: TRIGGER FINGER RELEASE: SHX641

## 2022-11-07 SURGERY — RELEASE, A1 PULLEY, FOR TRIGGER FINGER
Anesthesia: General | Site: Hand | Laterality: Right

## 2022-11-07 MED ORDER — LIDOCAINE 2% (20 MG/ML) 5 ML SYRINGE
INTRAMUSCULAR | Status: AC
  Filled 2022-11-07: qty 5

## 2022-11-07 MED ORDER — FENTANYL CITRATE (PF) 100 MCG/2ML IJ SOLN: 25.0000 ug | INTRAMUSCULAR | Status: DC | PRN

## 2022-11-07 MED ORDER — OXYCODONE HCL 5 MG PO TABS
5.0000 mg | ORAL_TABLET | Freq: Once | ORAL | Status: AC | PRN
  Administered 2022-11-07: 5 mg via ORAL

## 2022-11-07 MED ORDER — PROPOFOL 10 MG/ML IV BOLUS
INTRAVENOUS | Status: DC | PRN
  Administered 2022-11-07: 200 mg via INTRAVENOUS

## 2022-11-07 MED ORDER — PROPOFOL 10 MG/ML IV BOLUS
INTRAVENOUS | Status: AC
  Filled 2022-11-07: qty 20

## 2022-11-07 MED ORDER — ONDANSETRON HCL 4 MG/2ML IJ SOLN
INTRAMUSCULAR | Status: AC
  Filled 2022-11-07: qty 2

## 2022-11-07 MED ORDER — OXYCODONE HCL 5 MG/5ML PO SOLN: 5.0000 mg | Freq: Once | ORAL | Status: AC | PRN

## 2022-11-07 MED ORDER — CEFAZOLIN IN SODIUM CHLORIDE 3-0.9 GM/100ML-% IV SOLN
INTRAVENOUS | Status: AC
  Filled 2022-11-07: qty 100

## 2022-11-07 MED ORDER — BUPIVACAINE HCL (PF) 0.25 % IJ SOLN
INTRAMUSCULAR | Status: DC | PRN
  Administered 2022-11-07: 9 mL

## 2022-11-07 MED ORDER — KETOROLAC TROMETHAMINE 30 MG/ML IJ SOLN
INTRAMUSCULAR | Status: DC | PRN
  Administered 2022-11-07: 30 mg via INTRAVENOUS

## 2022-11-07 MED ORDER — BUPIVACAINE HCL (PF) 0.25 % IJ SOLN
INTRAMUSCULAR | Status: AC
  Filled 2022-11-07: qty 30

## 2022-11-07 MED ORDER — MIDAZOLAM HCL 2 MG/2ML IJ SOLN
INTRAMUSCULAR | Status: AC
  Filled 2022-11-07: qty 2

## 2022-11-07 MED ORDER — AMISULPRIDE (ANTIEMETIC) 5 MG/2ML IV SOLN: 10.0000 mg | Freq: Once | INTRAVENOUS | Status: DC | PRN

## 2022-11-07 MED ORDER — ACETAMINOPHEN 500 MG PO TABS: 1000.0000 mg | ORAL_TABLET | Freq: Once | ORAL | Status: DC

## 2022-11-07 MED ORDER — DEXAMETHASONE SODIUM PHOSPHATE 4 MG/ML IJ SOLN
INTRAMUSCULAR | Status: DC | PRN
  Administered 2022-11-07: 8 mg via INTRAVENOUS

## 2022-11-07 MED ORDER — HYDROCODONE-ACETAMINOPHEN 5-325 MG PO TABS: ORAL_TABLET | ORAL | 0 refills | Status: AC

## 2022-11-07 MED ORDER — CEFAZOLIN IN SODIUM CHLORIDE 3-0.9 GM/100ML-% IV SOLN
3.0000 g | INTRAVENOUS | Status: AC
  Administered 2022-11-07: 3 g via INTRAVENOUS

## 2022-11-07 MED ORDER — LACTATED RINGERS IV SOLN: INTRAVENOUS | Status: DC

## 2022-11-07 MED ORDER — LIDOCAINE HCL (CARDIAC) PF 100 MG/5ML IV SOSY
PREFILLED_SYRINGE | INTRAVENOUS | Status: DC | PRN
  Administered 2022-11-07: 100 mg via INTRAVENOUS

## 2022-11-07 MED ORDER — ONDANSETRON HCL 4 MG/2ML IJ SOLN
INTRAMUSCULAR | Status: DC | PRN
  Administered 2022-11-07: 4 mg via INTRAVENOUS

## 2022-11-07 MED ORDER — FENTANYL CITRATE (PF) 100 MCG/2ML IJ SOLN
INTRAMUSCULAR | Status: AC
  Filled 2022-11-07: qty 2

## 2022-11-07 MED ORDER — FENTANYL CITRATE (PF) 100 MCG/2ML IJ SOLN
INTRAMUSCULAR | Status: DC | PRN
  Administered 2022-11-07 (×2): 50 ug via INTRAVENOUS

## 2022-11-07 MED ORDER — OXYCODONE HCL 5 MG PO TABS
ORAL_TABLET | ORAL | Status: AC
  Filled 2022-11-07: qty 1

## 2022-11-07 MED ORDER — MIDAZOLAM HCL 5 MG/5ML IJ SOLN
INTRAMUSCULAR | Status: DC | PRN
  Administered 2022-11-07: 2 mg via INTRAVENOUS

## 2022-11-07 SURGICAL SUPPLY — 32 items
APL PRP STRL LF DISP 70% ISPRP (MISCELLANEOUS) ×1
BLADE SURG 15 STRL LF DISP TIS (BLADE) ×2 IMPLANT
BLADE SURG 15 STRL SS (BLADE) ×2
BNDG CMPR 5X2 CHSV 1 LYR STRL (GAUZE/BANDAGES/DRESSINGS) ×1
BNDG CMPR 9X4 STRL LF SNTH (GAUZE/BANDAGES/DRESSINGS)
BNDG COHESIVE 2X5 TAN ST LF (GAUZE/BANDAGES/DRESSINGS) ×1 IMPLANT
BNDG ESMARK 4X9 LF (GAUZE/BANDAGES/DRESSINGS) IMPLANT
CHLORAPREP W/TINT 26 (MISCELLANEOUS) ×1 IMPLANT
CORD BIPOLAR FORCEPS 12FT (ELECTRODE) ×1 IMPLANT
COVER BACK TABLE 60X90IN (DRAPES) ×1 IMPLANT
COVER MAYO STAND STRL (DRAPES) ×1 IMPLANT
CUFF TOURN SGL QUICK 18X4 (TOURNIQUET CUFF) ×1 IMPLANT
DRAPE EXTREMITY T 121X128X90 (DISPOSABLE) ×1 IMPLANT
DRAPE SURG 17X23 STRL (DRAPES) ×1 IMPLANT
GAUZE SPONGE 4X4 12PLY STRL (GAUZE/BANDAGES/DRESSINGS) ×1 IMPLANT
GAUZE XEROFORM 1X8 LF (GAUZE/BANDAGES/DRESSINGS) ×1 IMPLANT
GLOVE BIO SURGEON STRL SZ 6 (GLOVE) IMPLANT
GLOVE BIO SURGEON STRL SZ7.5 (GLOVE) ×1 IMPLANT
GLOVE BIOGEL PI IND STRL 8 (GLOVE) ×1 IMPLANT
GOWN STRL REUS W/ TWL LRG LVL3 (GOWN DISPOSABLE) ×1 IMPLANT
GOWN STRL REUS W/TWL LRG LVL3 (GOWN DISPOSABLE) ×2
GOWN STRL REUS W/TWL XL LVL3 (GOWN DISPOSABLE) ×1 IMPLANT
NDL HYPO 25X1 1.5 SAFETY (NEEDLE) ×1 IMPLANT
NEEDLE HYPO 25X1 1.5 SAFETY (NEEDLE) ×1 IMPLANT
NS IRRIG 1000ML POUR BTL (IV SOLUTION) ×1 IMPLANT
PACK BASIN DAY SURGERY FS (CUSTOM PROCEDURE TRAY) ×1 IMPLANT
STOCKINETTE 4X48 STRL (DRAPES) ×1 IMPLANT
SUT ETHILON 4 0 PS 2 18 (SUTURE) ×1 IMPLANT
SYR BULB EAR ULCER 3OZ GRN STR (SYRINGE) ×1 IMPLANT
SYR CONTROL 10ML LL (SYRINGE) ×1 IMPLANT
TOWEL GREEN STERILE FF (TOWEL DISPOSABLE) ×2 IMPLANT
UNDERPAD 30X36 HEAVY ABSORB (UNDERPADS AND DIAPERS) ×1 IMPLANT

## 2022-11-07 NOTE — Anesthesia Postprocedure Evaluation (Signed)
Anesthesia Post Note  Patient: Kristina Washington  Procedure(s) Performed: RIGHT LONG FINGER A-1 PULLEY RELEASE (Right: Hand)     Patient location during evaluation: PACU Anesthesia Type: General Level of consciousness: awake and alert Pain management: pain level controlled Vital Signs Assessment: post-procedure vital signs reviewed and stable Respiratory status: spontaneous breathing, nonlabored ventilation and respiratory function stable Cardiovascular status: blood pressure returned to baseline Postop Assessment: no apparent nausea or vomiting Anesthetic complications: no   No notable events documented.  Last Vitals:  Vitals:   11/07/22 1015 11/07/22 1030  BP: 121/73 115/74  Pulse: 68 60  Resp: 13 12  Temp: (!) 36.1 C   SpO2: 99% 95%    Last Pain:  Vitals:   11/07/22 1030  TempSrc:   PainSc: 0-No pain                 Shanda Howells

## 2022-11-07 NOTE — Transfer of Care (Signed)
Immediate Anesthesia Transfer of Care Note  Patient: Kristina Washington  Procedure(s) Performed: RIGHT LONG FINGER A-1 PULLEY RELEASE (Right: Hand)  Patient Location: PACU  Anesthesia Type:General  Level of Consciousness: awake, alert , and oriented  Airway & Oxygen Therapy: Patient Spontanous Breathing and Patient connected to face mask oxygen  Post-op Assessment: Report given to RN and Post -op Vital signs reviewed and stable  Post vital signs: Reviewed and stable  Last Vitals:  Vitals Value Taken Time  BP    Temp    Pulse 68 11/07/22 1016  Resp 13 11/07/22 1016  SpO2 99 % 11/07/22 1016  Vitals shown include unvalidated device data.  Last Pain:  Vitals:   11/07/22 0756  TempSrc: Oral  PainSc: 0-No pain      Patients Stated Pain Goal: 5 (11/07/22 0756)  Complications: No notable events documented.

## 2022-11-07 NOTE — Discharge Instructions (Addendum)
[facility] Instructions Hand Surgery  Wound Care: Keep your hand elevated above the level of your heart.  Do not allow it to dangle by your side.  Keep the dressing dry and do not remove it unless your doctor advises you to do so.  He will usually change it at the time of your post-op visit.  Moving your fingers is advised to stimulate circulation but will depend on the site of your surgery.  If you have a splint applied, your doctor will advise you regarding movement.  Activity: Do not drive or operate machinery today.  Rest today and then you may return to your normal activity and work as indicated by your physician.  Diet:  Drink liquids today or eat a light diet.  You may resume a regular diet tomorrow.    General expectations: Pain for two to three days. Fingers may become slightly swollen.  Call your doctor if any of the following occur: Severe pain not relieved by pain medication. Elevated temperature. Dressing soaked with blood. Inability to move fingers. White or bluish color to fingers.   No Tylenol until after 4:00pm today, if needed.    Post Anesthesia Home Care Instructions  Activity: Get plenty of rest for the remainder of the day. A responsible individual must stay with you for 24 hours following the procedure.  For the next 24 hours, DO NOT: -Drive a car -Advertising copywriter -Drink alcoholic beverages -Take any medication unless instructed by your physician -Make any legal decisions or sign important papers.  Meals: Start with liquid foods such as gelatin or soup. Progress to regular foods as tolerated. Avoid greasy, spicy, heavy foods. If nausea and/or vomiting occur, drink only clear liquids until the nausea and/or vomiting subsides. Call your physician if vomiting continues.  Special Instructions/Symptoms: Your throat may feel dry or sore from the anesthesia or the breathing tube placed in your throat during surgery. If this causes discomfort, gargle with  warm salt water. The discomfort should disappear within 24 hours.  If you had a scopolamine patch placed behind your ear for the management of post- operative nausea and/or vomiting:  1. The medication in the patch is effective for 72 hours, after which it should be removed.  Wrap patch in a tissue and discard in the trash. Wash hands thoroughly with soap and water. 2. You may remove the patch earlier than 72 hours if you experience unpleasant side effects which may include dry mouth, dizziness or visual disturbances. 3. Avoid touching the patch. Wash your hands with soap and water after contact with the patch.

## 2022-11-07 NOTE — Anesthesia Procedure Notes (Signed)
Procedure Name: LMA Insertion Date/Time: 11/07/2022 9:50 AM  Performed by: Cleda Clarks, CRNAPre-anesthesia Checklist: Patient identified, Emergency Drugs available, Suction available and Patient being monitored Patient Re-evaluated:Patient Re-evaluated prior to induction Oxygen Delivery Method: Circle system utilized Preoxygenation: Pre-oxygenation with 100% oxygen Induction Type: IV induction Ventilation: Mask ventilation without difficulty LMA: LMA inserted LMA Size: 4.0 Number of attempts: 1 Placement Confirmation: positive ETCO2 Tube secured with: Tape Dental Injury: Teeth and Oropharynx as per pre-operative assessment

## 2022-11-07 NOTE — Anesthesia Preprocedure Evaluation (Signed)
Anesthesia Evaluation  Patient identified by MRN, date of birth, ID band Patient awake    Reviewed: Allergy & Precautions, NPO status , Patient's Chart, lab work & pertinent test results  History of Anesthesia Complications Negative for: history of anesthetic complications  Airway Mallampati: I  TM Distance: >3 FB Neck ROM: Full    Dental no notable dental hx.    Pulmonary asthma    Pulmonary exam normal        Cardiovascular hypertension, Pt. on medications Normal cardiovascular exam     Neuro/Psych  Headaches    GI/Hepatic   Endo/Other    Morbid obesity  Renal/GU      Musculoskeletal  (+) Arthritis ,    Abdominal   Peds  Hematology   Anesthesia Other Findings   Reproductive/Obstetrics                              Anesthesia Physical Anesthesia Plan  ASA: 3  Anesthesia Plan: General   Post-op Pain Management: Tylenol PO (pre-op)*   Induction: Intravenous  PONV Risk Score and Plan: 3 and Treatment may vary due to age or medical condition, Midazolam, Dexamethasone and Ondansetron  Airway Management Planned: LMA  Additional Equipment: None  Intra-op Plan:   Post-operative Plan: Extubation in OR  Informed Consent: I have reviewed the patients History and Physical, chart, labs and discussed the procedure including the risks, benefits and alternatives for the proposed anesthesia with the patient or authorized representative who has indicated his/her understanding and acceptance.     Dental advisory given  Plan Discussed with: CRNA  Anesthesia Plan Comments:          Anesthesia Quick Evaluation

## 2022-11-07 NOTE — Op Note (Signed)
11/07/2022 [facility]  Operative Note  PREOPERATIVE DIAGNOSIS: RIGHT LONG FINGER STENOSING TENOSYNOVITIS  POSTOPERATIVE DIAGNOSIS:  RIGHT LONG FINGER STENOSING TENOSYNOVITIS  PROCEDURE: Procedure(s): RIGHT LONG FINGER A-1 PULLEY RELEASE   SURGEON:  Betha Loa, MD  ASSISTANT:  none.  ANESTHESIA:  General.  IV FLUIDS:  Per anesthesia flow sheet.  ESTIMATED BLOOD LOSS:  Minimal.  COMPLICATIONS:  None.  SPECIMENS:  None.  TOURNIQUET TIME:  Total Tourniquet Time Documented: Upper Arm (Right) - 9 minutes Total: Upper Arm (Right) - 9 minutes   DISPOSITION:  Stable to PACU.  LOCATION: [facility]  INDICATIONS: Kristina Washington is a 62 y.o. female with triggering right long finger.  It is bothersome to her and she wishes to have surgical release.  Risks, benefits and alternatives of surgery were discussed including the risk of blood loss, infection, damage to nerves, vessels, tendons, ligaments, bone, failure of surgery, need for additional surgery, complications with wound healing, continued pain, continued triggering and need for repeat surgery.  She voiced understanding of these risks and elected to proceed.  OPERATIVE COURSE:  After being identified preoperatively by myself, the patient and I agreed upon the procedure and site of procedure.  The surgical site was marked. Surgical consent had been signed. She was given IV Ancef as preoperative antibiotic prophylaxis. She was transported to the operating room and placed on the operating room table in supine position with the Right upper extremity on an arm board. General anesthesia was induced by the anesthesiologist.  The Right upper extremity was prepped and draped in normal sterile orthopedic fashion. A surgical pause was performed between surgeons, anesthesia, and operating room staff, and all were in agreement as to the patient, procedure, and site of procedure.  Tourniquet at the proximal aspect of  the forearm was inflated to 250 mmHg after exsanguination of the arm with an Esmarch bandage.  An incision was made at the volar aspect of the MP joint of the long finger.  This was carried into the subcutaneous tissues by spreading technique.  Bipolar electrocautery was used to obtain hemostasis.  The radial and ulnar digital nerves were protected throughout the case. The flexor sheath was identified.  The A1 pulley was identified and sharply incised.  It was released in its entirety.  The proximal 1-2 mm of the A2 pulley was vented to allow better excursion of the tendons.  The finger was placed through a range of motion and there was noted to be no catching.  The tendons were brought through the wound and any adherences released.  The wound was then copiously irrigated with sterile saline. It was closed with 4-0 nylon in a horizontal mattress fashion.  It was injected with 0.25% plain Marcaine to aid in postoperative analgesia.  It was dressed with sterile Xeroform, 4x4s, and wrapped lightly with a Coban dressing.  Tourniquet was deflated at 9 minutes.  The fingertips were pink with brisk capillary refill after deflation of the tourniquet.  The operative drapes were broken down and the patient was awoken from anesthesia safely.  She was transferred back to the stretcher and taken to the PACU in stable condition.   I will see her back in the office in 1 week for postoperative followup.  I will give her a prescription for Norco 5/325 1-2 tabs PO q6 hours prn pain, dispense # 15.    Betha Loa, MD Electronically signed, 11/07/22

## 2022-11-07 NOTE — H&P (Signed)
Kristina Washington is an 62 y.o. female.   Chief Complaint: trigger digit HPI: 62 yo female with triggering right long finger.  This is bothersome to her.  She wishes to have right long finger trigger release.  Allergies: No Known Allergies  Past Medical History:  Diagnosis Date   Arthritis    Bronchial asthma    Bronchitis    Cataracts, bilateral    Complication of anesthesia    slow to wake up   Headache    Heart murmur    mild no cardiologist Dr. Selena Batten    Hypercholesterolemia    Hypertension    Menorrhagia    MVP (mitral valve prolapse)    Pre-diabetes    pt. states pre Dm took metformin for a brief time as part of a weight loss program .no longer takes metformin Denies DM    Past Surgical History:  Procedure Laterality Date   CARPAL TUNNEL RELEASE     left wrist   ENDOMETRIAL ABLATION  03/23/10   FOOT SURGERY     BOTH RIGHT AND LEFT   KNEE ARTHROSCOPY  2002   right knee   TOTAL KNEE ARTHROPLASTY Right 08/20/2019   Procedure: TOTAL KNEE ARTHROPLASTY;  Surgeon: Frederico Hamman, MD;  Location: [facility];  Service: Orthopedics;  Laterality: Right;   TUBAL LIGATION      Family History: Family History  Problem Relation Age of Onset   Hypertension Father    Cancer Father        COLON   Diabetes Mother    Hypertension Mother    Cancer Mother    Diabetes Sister    Hypertension Sister    Diabetes Paternal Aunt    Diabetes Paternal Uncle     Social History:   reports that she has never smoked. She has never used smokeless tobacco. She reports that she does not currently use alcohol. She reports that she does not use drugs.  Medications: Medications Prior to Admission  Medication Sig Dispense Refill   albuterol (PROVENTIL HFA;VENTOLIN HFA) 108 (90 Base) MCG/ACT inhaler Inhale 2 puffs into the lungs every 4 (four) hours as needed for wheezing. 1 Inhaler 1   albuterol (PROVENTIL) (2.5 MG/3ML) 0.083% nebulizer solution Take 2.5 mg by nebulization every 6 (six) hours as  needed for wheezing or shortness of breath.     Ascorbic Acid (VITAMIN C PO) Take 1 tablet by mouth daily.     Cholecalciferol (VITAMIN D3) 50 MCG (2000 UT) TABS Take 2,000 Units by mouth daily.     Cyanocobalamin (VITAMIN B 12 PO) Take by mouth.     fluticasone furoate-vilanterol (BREO ELLIPTA) 200-25 MCG/ACT AEPB Inhale 1 puff into the lungs daily. 60 each 11   hydrochlorothiazide (HYDRODIURIL) 25 MG tablet Take 25 mg by mouth daily.     ibuprofen (ADVIL) 800 MG tablet Take 800 mg by mouth every 8 (eight) hours as needed.     losartan (COZAAR) 100 MG tablet Take 100 mg by mouth daily.     Multiple Vitamin (MULTIVITAMIN WITH MINERALS) TABS tablet Take 1 tablet by mouth daily.     rosuvastatin (CRESTOR) 10 MG tablet Take 10 mg by mouth daily.     cyclobenzaprine (FLEXERIL) 10 MG tablet Take 1 tablet (10 mg total) by mouth 3 (three) times daily as needed for muscle spasms. 15 tablet 0   fluticasone furoate-vilanterol (BREO ELLIPTA) 200-25 MCG/INH AEPB Inhale 1 puff into the lungs daily. (Patient not taking: Reported on 04/23/2022) 28 each 0   ketoconazole (  NIZORAL) 2 % cream Apply topically daily.     predniSONE (DELTASONE) 20 MG tablet Take 1 tablet (20 mg total) by mouth daily with breakfast. 7 tablet 0    No results found for this or any previous visit (from the past 48 hour(s)).  No results found.    Blood pressure 126/81, pulse 75, temperature 98.5 F (36.9 C), temperature source Oral, resp. rate 16, height 5\' 10"  (1.778 m), weight 129.8 kg, last menstrual period 03/01/2018, SpO2 97 %.  General appearance: alert, cooperative, and appears stated age Head: Normocephalic, without obvious abnormality, atraumatic Neck: supple, symmetrical, trachea midline Extremities: Intact sensation and capillary refill all digits.  +epl/fpl/io.  No wounds. Tender volar mp right long finger. Pulses: 2+ and symmetric Skin: Skin color, texture, turgor normal. No rashes or lesions Neurologic: Grossly  normal Incision/Wound: none  Assessment/Plan Right long finger trigger digit.  Non operative and operative treatment options have been discussed with the patient and patient wishes to proceed with operative treatment. Risks, benefits and alternatives of surgery were discussed including risks of blood loss, infection, damage to nerves/vessels/tendons/ligament/bone, failure of surgery, need for additional surgery, complication with wound healing, stiffness, recurrence.  She voiced understanding of these risks and elected to proceed.    Betha Loa 11/07/2022, 9:32 AM

## 2022-11-08 ENCOUNTER — Encounter (HOSPITAL_BASED_OUTPATIENT_CLINIC_OR_DEPARTMENT_OTHER): Payer: Self-pay | Admitting: Orthopedic Surgery

## 2022-11-14 ENCOUNTER — Other Ambulatory Visit: Payer: Managed Care, Other (non HMO)

## 2022-11-14 ENCOUNTER — Other Ambulatory Visit: Payer: Self-pay | Admitting: Obstetrics & Gynecology

## 2023-02-26 ENCOUNTER — Other Ambulatory Visit: Payer: Self-pay | Admitting: Family Medicine

## 2023-02-26 DIAGNOSIS — Z1231 Encounter for screening mammogram for malignant neoplasm of breast: Secondary | ICD-10-CM

## 2023-04-25 ENCOUNTER — Ambulatory Visit
Admission: RE | Admit: 2023-04-25 | Discharge: 2023-04-25 | Disposition: A | Discharge status: Home / Self Care | Payer: Managed Care, Other (non HMO) | Source: Ambulatory Visit | Attending: Family Medicine | Admitting: Family Medicine

## 2023-04-25 ENCOUNTER — Ambulatory Visit: Payer: Managed Care, Other (non HMO)

## 2023-04-25 DIAGNOSIS — Z1231 Encounter for screening mammogram for malignant neoplasm of breast: Secondary | ICD-10-CM

## 2023-05-23 ENCOUNTER — Emergency Department (HOSPITAL_BASED_OUTPATIENT_CLINIC_OR_DEPARTMENT_OTHER)
Admission: EM | Admit: 2023-05-23 | Discharge: 2023-05-23 | Disposition: A | Discharge status: Home / Self Care | Payer: Managed Care, Other (non HMO) | Attending: Emergency Medicine | Admitting: Emergency Medicine

## 2023-05-23 ENCOUNTER — Other Ambulatory Visit: Payer: Self-pay

## 2023-05-23 ENCOUNTER — Other Ambulatory Visit (HOSPITAL_BASED_OUTPATIENT_CLINIC_OR_DEPARTMENT_OTHER): Payer: Self-pay

## 2023-05-23 ENCOUNTER — Emergency Department (HOSPITAL_BASED_OUTPATIENT_CLINIC_OR_DEPARTMENT_OTHER): Payer: Managed Care, Other (non HMO) | Admitting: Radiology

## 2023-05-23 DIAGNOSIS — Z20822 Contact with and (suspected) exposure to covid-19: Secondary | ICD-10-CM | POA: Insufficient documentation

## 2023-05-23 DIAGNOSIS — J40 Bronchitis, not specified as acute or chronic: Secondary | ICD-10-CM | POA: Insufficient documentation

## 2023-05-23 DIAGNOSIS — R0602 Shortness of breath: Secondary | ICD-10-CM | POA: Diagnosis present

## 2023-05-23 LAB — RESP PANEL BY RT-PCR (RSV, FLU A&B, COVID)  RVPGX2
Influenza A by PCR: NEGATIVE
Influenza B by PCR: NEGATIVE
Resp Syncytial Virus by PCR: NEGATIVE
SARS Coronavirus 2 by RT PCR: NEGATIVE

## 2023-05-23 LAB — CBC
HCT: 34.1 % — ABNORMAL LOW (ref 36.0–46.0)
Hemoglobin: 11 g/dL — ABNORMAL LOW (ref 12.0–15.0)
MCH: 27.4 pg (ref 26.0–34.0)
MCHC: 32.3 g/dL (ref 30.0–36.0)
MCV: 85 fL (ref 80.0–100.0)
Platelets: 307 10*3/uL (ref 150–400)
RBC: 4.01 MIL/uL (ref 3.87–5.11)
RDW: 14.3 % (ref 11.5–15.5)
WBC: 8 10*3/uL (ref 4.0–10.5)
nRBC: 0 % (ref 0.0–0.2)

## 2023-05-23 LAB — BASIC METABOLIC PANEL
Anion gap: 8 (ref 5–15)
BUN: 13 mg/dL (ref 6–20)
CO2: 30 mmol/L (ref 22–32)
Calcium: 9.3 mg/dL (ref 8.9–10.3)
Chloride: 103 mmol/L (ref 98–111)
Creatinine, Ser: 0.89 mg/dL (ref 0.44–1.00)
GFR, Estimated: >60 mL/min (ref >60)
Glucose, Bld: 99 mg/dL (ref 70–99)
Potassium: 3.6 mmol/L (ref 3.5–5.1)
Sodium: 141 mmol/L (ref 135–145)

## 2023-05-23 LAB — TROPONIN I (HIGH SENSITIVITY): Troponin I (High Sensitivity): 2 ng/L (ref <18)

## 2023-05-23 MED ORDER — ALBUTEROL SULFATE (2.5 MG/3ML) 0.083% IN NEBU: 2.5000 mg | INHALATION_SOLUTION | Freq: Four times a day (QID) | RESPIRATORY_TRACT | 0 refills | Status: DC | PRN

## 2023-05-23 MED ORDER — DOXYCYCLINE HYCLATE 100 MG PO TABS
100.0000 mg | ORAL_TABLET | Freq: Once | ORAL | Status: AC
  Administered 2023-05-23: 100 mg via ORAL
  Filled 2023-05-23: qty 1

## 2023-05-23 MED ORDER — PREDNISONE 20 MG PO TABS: ORAL_TABLET | ORAL | 0 refills | Status: DC

## 2023-05-23 MED ORDER — PREDNISONE 50 MG PO TABS
60.0000 mg | ORAL_TABLET | Freq: Once | ORAL | Status: AC
  Administered 2023-05-23: 60 mg via ORAL
  Filled 2023-05-23: qty 1

## 2023-05-23 MED ORDER — ALBUTEROL SULFATE HFA 108 (90 BASE) MCG/ACT IN AERS
4.0000 | INHALATION_SPRAY | Freq: Once | RESPIRATORY_TRACT | Status: AC
  Administered 2023-05-23: 4 via RESPIRATORY_TRACT
  Filled 2023-05-23: qty 6.7

## 2023-05-23 MED ORDER — AEROCHAMBER PLUS FLO-VU MISC
1.0000 | Freq: Once | Status: AC
  Administered 2023-05-23: 1
  Filled 2023-05-23: qty 1

## 2023-05-23 MED ORDER — ALBUTEROL SULFATE (2.5 MG/3ML) 0.083% IN NEBU
2.5000 mg | INHALATION_SOLUTION | Freq: Four times a day (QID) | RESPIRATORY_TRACT | 0 refills | Status: DC | PRN
  Filled 2023-05-23: qty 75, 7d supply, fill #0

## 2023-05-23 MED ORDER — DOXYCYCLINE HYCLATE 100 MG PO CAPS
100.0000 mg | ORAL_CAPSULE | Freq: Two times a day (BID) | ORAL | 0 refills | Status: DC
Start: 2023-05-23 — End: 2024-04-12

## 2023-05-23 NOTE — ED Triage Notes (Addendum)
Pt c/o cough for 4 days along with left side chest pain that began last night.  Pt reports taking ibuprofen at home with some relief.  H/o bronchitis when similar episodes of cough have occurred in the past.  Denies SOB or CP at present, last dose of advil at 0400 today.

## 2023-05-23 NOTE — ED Notes (Signed)
ED Provider at bedside. 

## 2023-05-23 NOTE — ED Provider Notes (Signed)
[place] EMERGENCY DEPARTMENT AT [facility] Provider Note   CSN: [id number] Arrival date & time: 05/23/23  1132     History  Chief Complaint  Patient presents with   Chest Pain   Cough    Kristina Washington is a 62 y.o. female.  62 yo F with a chief complaints of cough and difficulty breathing.  Going on for about 4 days.  She also felt your symptoms started to get better and then got worse again over the past 24 hours.  She has a history of asthma and has gotten frequent bronchitis.  She tells me that she ran out of her nebulizer at home.  Started having some left-sided chest discomfort that sharp and worse with deep breathing and coughing.  Had 1 episode of nausea and vomiting that seem to consist mostly of mucus.  Denies abdominal pain.   Chest Pain Associated symptoms: cough   Cough Associated symptoms: chest pain        Home Medications Prior to Admission medications   Medication Sig Start Date End Date Taking? Authorizing Provider  doxycycline (VIBRAMYCIN) 100 MG capsule Take 1 capsule (100 mg total) by mouth 2 (two) times daily. One po bid x 7 days 05/23/23  Yes Melene Plan, DO  predniSONE (DELTASONE) 20 MG tablet 2 tabs po daily x 4 days 05/23/23  Yes Melene Plan, DO  albuterol (PROVENTIL HFA;VENTOLIN HFA) 108 (90 Base) MCG/ACT inhaler Inhale 2 puffs into the lungs every 4 (four) hours as needed for wheezing. 08/06/15   Roxy Horseman, PA-C  albuterol (PROVENTIL) (2.5 MG/3ML) 0.083% nebulizer solution Take 3 mLs (2.5 mg total) by nebulization every 6 (six) hours as needed for wheezing or shortness of breath. 05/23/23   Melene Plan, DO  Ascorbic Acid (VITAMIN C PO) Take 1 tablet by mouth daily.    [provider]  Cholecalciferol (VITAMIN D3) 50 MCG (2000 UT) TABS Take 2,000 Units by mouth daily.    [provider]  Cyanocobalamin (VITAMIN B 12 PO) Take by mouth.    [provider]  cyclobenzaprine (FLEXERIL) 10 MG tablet Take 1 tablet (10  mg total) by mouth 3 (three) times daily as needed for muscle spasms. 05/24/16   Street, Mercedes, PA-C  fluticasone furoate-vilanterol (BREO ELLIPTA) 200-25 MCG/ACT AEPB Inhale 1 puff into the lungs daily. 03/05/22   Olalere, Onnie Boer A, MD  hydrochlorothiazide (HYDRODIURIL) 25 MG tablet Take 25 mg by mouth daily. 04/15/19   [provider]  HYDROcodone-acetaminophen (NORCO/VICODIN) 5-325 MG tablet 1-2 tabs PO q6 hours prn pain 11/07/22   Betha Loa, MD  ibuprofen (ADVIL) 800 MG tablet Take 800 mg by mouth every 8 (eight) hours as needed. 04/08/22   [provider]  ketoconazole (NIZORAL) 2 % cream Apply topically daily. 10/19/19   [provider]  losartan (COZAAR) 100 MG tablet Take 100 mg by mouth daily. 04/15/19   [provider]  Multiple Vitamin (MULTIVITAMIN WITH MINERALS) TABS tablet Take 1 tablet by mouth daily.    [provider]  rosuvastatin (CRESTOR) 10 MG tablet Take 10 mg by mouth daily. 11/15/21   [provider]      Allergies    Patient has no known allergies.    Review of Systems   Review of Systems  Respiratory:  Positive for cough.   Cardiovascular:  Positive for chest pain.    Physical Exam Updated Vital Signs BP (!) 144/95 (BP Location: Right Arm)   Pulse 87   Temp 98.1 F (  36.7 C)   Resp 18   LMP 03/01/2018 (Within Weeks)   SpO2 99%  Physical Exam Vitals and nursing note reviewed.  Constitutional:      General: She is not in acute distress.    Appearance: She is well-developed. She is not diaphoretic.  HENT:     Head: Normocephalic and atraumatic.  Eyes:     Pupils: Pupils are equal, round, and reactive to light.  Cardiovascular:     Rate and Rhythm: Normal rate and regular rhythm.     Heart sounds: No murmur heard.    No friction rub. No gallop.  Pulmonary:     Effort: Pulmonary effort is normal.     Breath sounds: No wheezing or rales.     Comments: Diminished breath sounds in all fields with  prolonged expiratory effort Chest:     Chest wall: No tenderness.  Abdominal:     General: There is no distension.     Palpations: Abdomen is soft.     Tenderness: There is no abdominal tenderness.  Musculoskeletal:        General: No tenderness.     Cervical back: Normal range of motion and neck supple.  Skin:    General: Skin is warm and dry.  Neurological:     Mental Status: She is alert and oriented to person, place, and time.  Psychiatric:        Behavior: Behavior normal.     ED Results / Procedures / Treatments   Labs (all labs ordered are listed, but only abnormal results are displayed) Labs Reviewed  CBC - Abnormal; Notable for the following components:      Result Value   Hemoglobin 11.0 (*)    HCT 34.1 (*)    All other components within normal limits  RESP PANEL BY RT-PCR (RSV, FLU A&B, COVID)  RVPGX2  BASIC METABOLIC PANEL  TROPONIN I (HIGH SENSITIVITY)  TROPONIN I (HIGH SENSITIVITY)    EKG EKG Interpretation Date/Time:  Friday May 23 2023 11:41:03 EST Ventricular Rate:  82 PR Interval:  206 QRS Duration:  92 QT Interval:  456 QTC Calculation: 532 R Axis:   18  Text Interpretation: Normal sinus rhythm Incomplete right bundle branch block Nonspecific T wave abnormality Abnormal ECG No significant change since last tracing Confirmed by Melene Plan [phone]) on 05/23/2023 1:09:39 PM  Radiology No results found.  Procedures Procedures    Medications Ordered in ED Medications  albuterol (VENTOLIN HFA) 108 (90 Base) MCG/ACT inhaler 4 puff (has no administration in time range)  Aerochamber Plus device 1 each (has no administration in time range)  predniSONE (DELTASONE) tablet 60 mg (has no administration in time range)  doxycycline (VIBRA-TABS) tablet 100 mg (has no administration in time range)    ED Course/ Medical Decision Making/ A&P                                 Medical Decision Making Amount and/or Complexity of Data Reviewed Labs:  ordered. Radiology: ordered.  Risk Prescription drug management.   62 yo F with a chief complaints of cough congestion going on for about 4 days now.  Says like she had some improvement of her symptoms and then worsening.  She also has a history of asthma and has been told she has had frequent bronchitis.  She has had increased sputum and change in sputum as well.  I will treat her as a  COPD exacerbation.  Give her an albuterol inhaler here to take home as she has run out of her nebulizer solution at home.  Her troponin is negative, no significant electrolyte abnormalities, no significant anemia.  Chest x-ray independently interpreted by me without focal infiltrate or pneumothorax.  1:33 PM:  I have discussed the diagnosis/risks/treatment options with the patient and family.  Evaluation and diagnostic testing in the emergency department does not suggest an emergent condition requiring admission or immediate intervention beyond what has been performed at this time.  They will follow up with PCP. We also discussed returning to the ED immediately if new or worsening sx occur. We discussed the sx which are most concerning (e.g., sudden worsening pain, fever, inability to tolerate by mouth, need to use inhaler more often than every 4 hours) that necessitate immediate return. Medications administered to the patient during their visit and any new prescriptions provided to the patient are listed below.  Medications given during this visit Medications  albuterol (VENTOLIN HFA) 108 (90 Base) MCG/ACT inhaler 4 puff (has no administration in time range)  Aerochamber Plus device 1 each (has no administration in time range)  predniSONE (DELTASONE) tablet 60 mg (has no administration in time range)  doxycycline (VIBRA-TABS) tablet 100 mg (has no administration in time range)     The patient appears reasonably screen and/or stabilized for discharge and I doubt any other medical condition or other [facility]  requiring further screening, evaluation, or treatment in the ED at this time prior to discharge.          Final Clinical Impression(s) / ED Diagnoses Final diagnoses:  Bronchitis    Rx / DC Orders ED Discharge Orders          Ordered    albuterol (PROVENTIL) (2.5 MG/3ML) 0.083% nebulizer solution  Every 6 hours PRN        05/23/23 1331    predniSONE (DELTASONE) 20 MG tablet        05/23/23 1331    doxycycline (VIBRAMYCIN) 100 MG capsule  2 times daily        05/23/23 1331              [place], DO 05/23/23 1334

## 2023-05-23 NOTE — Discharge Instructions (Addendum)
Use your inhaler every 4 hours(6 puffs) while awake, return for sudden worsening shortness of breath, or if you need to use your inhaler more often.  ° °

## 2023-06-02 ENCOUNTER — Other Ambulatory Visit (HOSPITAL_BASED_OUTPATIENT_CLINIC_OR_DEPARTMENT_OTHER): Payer: Self-pay

## 2023-06-06 ENCOUNTER — Ambulatory Visit: Payer: Managed Care, Other (non HMO)

## 2024-01-26 ENCOUNTER — Other Ambulatory Visit: Payer: Self-pay | Admitting: Orthopedic Surgery

## 2024-01-26 DIAGNOSIS — M542 Cervicalgia: Secondary | ICD-10-CM

## 2024-01-27 ENCOUNTER — Other Ambulatory Visit

## 2024-01-30 ENCOUNTER — Telehealth: Admitting: Physician Assistant

## 2024-01-30 DIAGNOSIS — J208 Acute bronchitis due to other specified organisms: Secondary | ICD-10-CM | POA: Diagnosis not present

## 2024-01-30 DIAGNOSIS — B9689 Other specified bacterial agents as the cause of diseases classified elsewhere: Secondary | ICD-10-CM | POA: Diagnosis not present

## 2024-01-30 MED ORDER — BENZONATATE 100 MG PO CAPS: 100.0000 mg | ORAL_CAPSULE | Freq: Three times a day (TID) | ORAL | 0 refills | Status: AC | PRN

## 2024-01-30 MED ORDER — AMOXICILLIN-POT CLAVULANATE 875-125 MG PO TABS: 1.0000 | ORAL_TABLET | Freq: Two times a day (BID) | ORAL | 0 refills | Status: DC

## 2024-01-30 MED ORDER — PREDNISONE 10 MG PO TABS: ORAL_TABLET | ORAL | 0 refills | Status: DC

## 2024-01-30 NOTE — Progress Notes (Signed)
 Virtual Visit Consent   Kristina Washington, you are scheduled for a virtual visit with a [facility] provider today. Just as with appointments in the office, your consent must be obtained to participate. Your consent will be active for this visit and any virtual visit you may have with one of our providers in the next 365 days. If you have a MyChart account, a copy of this consent can be sent to you electronically.  As this is a virtual visit, video technology does not allow for your provider to perform a traditional examination. This may limit your provider's ability to fully assess your condition. If your provider identifies any concerns that need to be evaluated in person or the need to arrange testing (such as labs, EKG, etc.), we will make arrangements to do so. Although advances in technology are sophisticated, we cannot ensure that it will always work on either your end or our end. If the connection with a video visit is poor, the visit may have to be switched to a telephone visit. With either a video or telephone visit, we are not always able to ensure that we have a secure connection.  By engaging in this virtual visit, you consent to the provision of healthcare and authorize for your insurance to be billed (if applicable) for the services provided during this visit. Depending on your insurance coverage, you may receive a charge related to this service.  I need to obtain your verbal consent now. Are you willing to proceed with your visit today? Kristina Washington has provided verbal consent on 01/30/2024 for a virtual visit (video or telephone). Kristina CHRISTELLA Dickinson, PA-C  Date: 01/30/2024 6:42 PM   Virtual Visit via Video Note   I, Kristina Washington, connected with  Kristina Washington  ([id number], 61-Oct-1962) on 01/30/24 at  6:30 PM EDT by a video-enabled telemedicine application and verified that I am speaking with the correct person using two identifiers.  Location: Patient: Virtual Visit  Location Patient: Home Provider: Virtual Visit Location Provider: Home Office   I discussed the limitations of evaluation and management by telemedicine and the availability of in person appointments. The patient expressed understanding and agreed to proceed.    History of Present Illness: Kristina Washington is a 63 y.o. who identifies as a female who was assigned female at birth, and is being seen today for cough and chest tightness.  HPI: Cough This is a new problem. The current episode started in the past 7 days (Started Monday, 01/26/24). The problem has been gradually worsening. The problem occurs every few minutes. The cough is Productive of sputum and productive of purulent sputum. Associated symptoms include chest pain (tightness), headaches, myalgias, nasal congestion, postnasal drip, rhinorrhea, a sore throat and shortness of breath. Pertinent negatives include no chills, ear congestion, ear pain, fever, sweats or wheezing. The symptoms are aggravated by lying down. She has tried a beta-agonist inhaler for the symptoms. The treatment provided mild relief. Her past medical history is significant for asthma and bronchitis.    Problems:  Patient Active Problem List   Diagnosis Date Noted   Primary localized osteoarthritis of right knee 08/20/2019   Hypertension 01/26/2019   Hypercholesteremia 01/26/2019   Obesity, morbid (HCC) 03/10/2013   Fibroid uterus 09/16/2012   Bronchial asthma     Allergies: No Known Allergies Medications:  Current Outpatient Medications:    amoxicillin -clavulanate (AUGMENTIN ) 875-125 MG tablet, Take 1 tablet by mouth 2 (two) times daily., Disp: 14 tablet, Rfl: 0   benzonatate  (  TESSALON ) 100 MG capsule, Take 1-2 capsules (100-200 mg total) by mouth 3 (three) times daily as needed., Disp: 30 capsule, Rfl: 0   predniSONE  (DELTASONE ) 10 MG tablet, Days 1-4 take 4 tablets (40 mg) daily  Days 5-8 take 3 tablets (30 mg) daily, Days 9-11 take 2 tablets (20 mg) daily,  Days 12-14 take 1 tablet (10 mg) daily., Disp: 37 tablet, Rfl: 0   albuterol  (PROVENTIL  HFA;VENTOLIN  HFA) 108 (90 Base) MCG/ACT inhaler, Inhale 2 puffs into the lungs every 4 (four) hours as needed for wheezing., Disp: 1 Inhaler, Rfl: 1   albuterol  (PROVENTIL ) (2.5 MG/3ML) 0.083% nebulizer solution, Take 3 mLs (2.5 mg total) by nebulization every 6 (six) hours as needed for wheezing or shortness of breath., Disp: 75 mL, Rfl: 0   Ascorbic Acid (VITAMIN C PO), Take 1 tablet by mouth daily., Disp: , Rfl:    Cholecalciferol (VITAMIN D3) 50 MCG (2000 UT) TABS, Take 2,000 Units by mouth daily., Disp: , Rfl:    Cyanocobalamin (VITAMIN B 12 PO), Take by mouth., Disp: , Rfl:    cyclobenzaprine  (FLEXERIL ) 10 MG tablet, Take 1 tablet (10 mg total) by mouth 3 (three) times daily as needed for muscle spasms., Disp: 15 tablet, Rfl: 0   doxycycline  (VIBRAMYCIN ) 100 MG capsule, Take 1 capsule (100 mg total) by mouth 2 (two) times daily. One po bid x 7 days, Disp: 14 capsule, Rfl: 0   fluticasone  furoate-vilanterol (BREO ELLIPTA ) 200-25 MCG/ACT AEPB, Inhale 1 puff into the lungs daily., Disp: 60 each, Rfl: 11   hydrochlorothiazide  (HYDRODIURIL ) 25 MG tablet, Take 25 mg by mouth daily., Disp: , Rfl:    HYDROcodone -acetaminophen  (NORCO/VICODIN) 5-325 MG tablet, 1-2 tabs PO q6 hours prn pain, Disp: 15 tablet, Rfl: 0   ibuprofen (ADVIL) 800 MG tablet, Take 800 mg by mouth every 8 (eight) hours as needed., Disp: , Rfl:    ketoconazole (NIZORAL) 2 % cream, Apply topically daily., Disp: , Rfl:    losartan  (COZAAR ) 100 MG tablet, Take 100 mg by mouth daily., Disp: , Rfl:    Multiple Vitamin (MULTIVITAMIN WITH MINERALS) TABS tablet, Take 1 tablet by mouth daily., Disp: , Rfl:    rosuvastatin (CRESTOR) 10 MG tablet, Take 10 mg by mouth daily., Disp: , Rfl:   Observations/Objective: Patient is well-developed, well-nourished in no acute distress.  Resting comfortably  at home.  Head is normocephalic, atraumatic.  No labored  breathing.  Speech is clear and coherent with logical content.  Patient is alert and oriented at baseline.    Assessment and Plan: 1. Acute bacterial bronchitis (Primary) - amoxicillin -clavulanate (AUGMENTIN ) 875-125 MG tablet; Take 1 tablet by mouth 2 (two) times daily.  Dispense: 14 tablet; Refill: 0 - predniSONE  (DELTASONE ) 10 MG tablet; Days 1-4 take 4 tablets (40 mg) daily  Days 5-8 take 3 tablets (30 mg) daily, Days 9-11 take 2 tablets (20 mg) daily, Days 12-14 take 1 tablet (10 mg) daily.  Dispense: 37 tablet; Refill: 0 - benzonatate  (TESSALON ) 100 MG capsule; Take 1-2 capsules (100-200 mg total) by mouth 3 (three) times daily as needed.  Dispense: 30 capsule; Refill: 0  - Worsening over a week despite OTC medications - Will treat with Augmentin , Prednisone , and tessalon  perles - Can continue Mucinex   - Push fluids.  - Rest.  - Steam and humidifier can help - Seek in person evaluation if worsening or symptoms fail to improve    Follow Up Instructions: I discussed the assessment and treatment plan with the patient. The  patient was provided an opportunity to ask questions and all were answered. The patient agreed with the plan and demonstrated an understanding of the instructions.  A copy of instructions were sent to the patient via MyChart unless otherwise noted below.    The patient was advised to call back or seek an in-person evaluation if the symptoms worsen or if the condition fails to improve as anticipated.    Kristina CHRISTELLA Dickinson, PA-C

## 2024-01-30 NOTE — Patient Instructions (Signed)
 Kristina Washington, thank you for joining [name], PA-C for today's virtual visit.  While this provider is not your primary care provider (PCP), if your PCP is located in our provider database this encounter information will be shared with them immediately following your visit.   A [facility] MyChart account gives you access to today's visit and all your visits, tests, and labs performed at [facility]  click here if you don't have a [facility] MyChart account or go to [email or web address]  Consent: (Patient) Kristina Washington provided verbal consent for this virtual visit at the beginning of the encounter.  Current Medications:  Current Outpatient Medications:    amoxicillin -clavulanate (AUGMENTIN ) 875-125 MG tablet, Take 1 tablet by mouth 2 (two) times daily., Disp: 14 tablet, Rfl: 0   benzonatate  (TESSALON ) 100 MG capsule, Take 1-2 capsules (100-200 mg total) by mouth 3 (three) times daily as needed., Disp: 30 capsule, Rfl: 0   predniSONE  (DELTASONE ) 10 MG tablet, Days 1-4 take 4 tablets (40 mg) daily  Days 5-8 take 3 tablets (30 mg) daily, Days 9-11 take 2 tablets (20 mg) daily, Days 12-14 take 1 tablet (10 mg) daily., Disp: 37 tablet, Rfl: 0   albuterol  (PROVENTIL  HFA;VENTOLIN  HFA) 108 (90 Base) MCG/ACT inhaler, Inhale 2 puffs into the lungs every 4 (four) hours as needed for wheezing., Disp: 1 Inhaler, Rfl: 1   albuterol  (PROVENTIL ) (2.5 MG/3ML) 0.083% nebulizer solution, Take 3 mLs (2.5 mg total) by nebulization every 6 (six) hours as needed for wheezing or shortness of breath., Disp: 75 mL, Rfl: 0   Ascorbic Acid (VITAMIN C PO), Take 1 tablet by mouth daily., Disp: , Rfl:    Cholecalciferol (VITAMIN D3) 50 MCG (2000 UT) TABS, Take 2,000 Units by mouth daily., Disp: , Rfl:    Cyanocobalamin (VITAMIN B 12 PO), Take by mouth., Disp: , Rfl:    cyclobenzaprine  (FLEXERIL ) 10 MG tablet, Take 1 tablet (10 mg total) by mouth 3 (three) times daily as needed for muscle  spasms., Disp: 15 tablet, Rfl: 0   doxycycline  (VIBRAMYCIN ) 100 MG capsule, Take 1 capsule (100 mg total) by mouth 2 (two) times daily. One po bid x 7 days, Disp: 14 capsule, Rfl: 0   fluticasone  furoate-vilanterol (BREO ELLIPTA ) 200-25 MCG/ACT AEPB, Inhale 1 puff into the lungs daily., Disp: 60 each, Rfl: 11   hydrochlorothiazide  (HYDRODIURIL ) 25 MG tablet, Take 25 mg by mouth daily., Disp: , Rfl:    HYDROcodone -acetaminophen  (NORCO/VICODIN) 5-325 MG tablet, 1-2 tabs PO q6 hours prn pain, Disp: 15 tablet, Rfl: 0   ibuprofen (ADVIL) 800 MG tablet, Take 800 mg by mouth every 8 (eight) hours as needed., Disp: , Rfl:    ketoconazole (NIZORAL) 2 % cream, Apply topically daily., Disp: , Rfl:    losartan  (COZAAR ) 100 MG tablet, Take 100 mg by mouth daily., Disp: , Rfl:    Multiple Vitamin (MULTIVITAMIN WITH MINERALS) TABS tablet, Take 1 tablet by mouth daily., Disp: , Rfl:    rosuvastatin (CRESTOR) 10 MG tablet, Take 10 mg by mouth daily., Disp: , Rfl:    Medications ordered in this encounter:  Meds ordered this encounter  Medications   amoxicillin -clavulanate (AUGMENTIN ) 875-125 MG tablet    Sig: Take 1 tablet by mouth 2 (two) times daily.    Dispense:  14 tablet    Refill:  0    Supervising Provider:   [name] [[id number]]   predniSONE  (DELTASONE ) 10 MG tablet    Sig: Days 1-4 take 4 tablets (40  mg) daily  Days 5-8 take 3 tablets (30 mg) daily, Days 9-11 take 2 tablets (20 mg) daily, Days 12-14 take 1 tablet (10 mg) daily.    Dispense:  37 tablet    Refill:  0    Supervising Provider:   [name], [name] [[id number]]   benzonatate  (TESSALON ) 100 MG capsule    Sig: Take 1-2 capsules (100-200 mg total) by mouth 3 (three) times daily as needed.    Dispense:  30 capsule    Refill:  0    Supervising Provider:   [name] [[id number]]     *If you need refills on other medications prior to your next appointment, please contact your pharmacy*  Follow-Up: Call back or seek an in-person  evaluation if the symptoms worsen or if the condition fails to improve as anticipated.  [facility] [phone]  Other Instructions Acute Bronchitis, Adult  Acute bronchitis is sudden inflammation of the main airways (bronchi) that come off the windpipe (trachea) in the lungs. The swelling causes the airways to get smaller and make more mucus than normal. This can make it hard to breathe and can cause coughing or noisy breathing (wheezing). Acute bronchitis may last several weeks. The cough may last longer. Allergies, asthma, and exposure to smoke may make the condition worse. What are the causes? This condition can be caused by germs and by substances that irritate the lungs, including: Cold and flu viruses. The most common cause of this condition is the virus that causes the common cold. Bacteria. This is less common. Breathing in substances that irritate the lungs, including: Smoke from cigarettes and other forms of tobacco. Dust and pollen. Fumes from household cleaning products, gases, or burned fuel. Indoor or outdoor air pollution. What increases the risk? The following factors may make you more likely to develop this condition: A weak body's defense system, also called the immune system. A condition that affects your lungs and breathing, such as asthma. What are the signs or symptoms? Common symptoms of this condition include: Coughing. This may bring up clear, yellow, or green mucus from your lungs (sputum). Wheezing. Runny or stuffy nose. Having too much mucus in your lungs (chest congestion). Shortness of breath. Aches and pains, including sore throat or chest. How is this diagnosed? This condition is usually diagnosed based on: Your symptoms and medical history. A physical exam. You may also have other tests, including tests to rule out other conditions, such as pneumonia. These tests include: A test of lung function. Test of a mucus sample to look for  the presence of bacteria. Tests to check the oxygen level in your blood. Blood tests. Chest X-ray. How is this treated? Most cases of acute bronchitis clear up over time without treatment. Your health care provider may recommend: Drinking more fluids to help thin your mucus so it is easier to cough up. Taking inhaled medicine (inhaler) to improve air flow in and out of your lungs. Using a vaporizer or a humidifier. These are machines that add water to the air to help you breathe better. Taking a medicine that thins mucus and clears congestion (expectorant). Taking a medicine that prevents or stops coughing (cough suppressant). It is not common to take an antibiotic medicine for this condition. Follow these instructions at home:  Take over-the-counter and prescription medicines only as told by your health care provider. Use an inhaler, vaporizer, or humidifier as told by your health care provider. Take two teaspoons (10 mL) of  honey at bedtime to lessen coughing at night. Drink enough fluid to keep your urine pale yellow. Do not use any products that contain nicotine or tobacco. These products include cigarettes, chewing tobacco, and vaping devices, such as e-cigarettes. If you need help quitting, ask your health care provider. Get plenty of rest. Return to your normal activities as told by your health care provider. Ask your health care provider what activities are safe for you. Keep all follow-up visits. This is important. How is this prevented? To lower your risk of getting this condition again: Wash your hands often with soap and water for at least 20 seconds. If soap and water are not available, use hand sanitizer. Avoid contact with people who have cold symptoms. Try not to touch your mouth, nose, or eyes with your hands. Avoid breathing in smoke or chemical fumes. Breathing smoke or chemical fumes will make your condition worse. Get the flu shot every year. Contact a health care  provider if: Your symptoms do not improve after 2 weeks. You have trouble coughing up the mucus. Your cough keeps you awake at night. You have a fever. Get help [place] away if you: Cough up blood. Feel pain in your chest. Have severe shortness of breath. Faint or keep feeling like you are going to faint. Have a severe headache. Have a fever or chills that get worse. These symptoms may represent a serious problem that is an emergency. Do not wait to see if the symptoms will go away. Get medical help [place] away. Call your local emergency services (911 in the [place] Do not drive yourself to the hospital. Summary Acute bronchitis is inflammation of the main airways (bronchi) that come off the windpipe (trachea) in the lungs. The swelling causes the airways to get smaller and make more mucus than normal. Drinking more fluids can help thin your mucus so it is easier to cough up. Take over-the-counter and prescription medicines only as told by your health care provider. Do not use any products that contain nicotine or tobacco. These products include cigarettes, chewing tobacco, and vaping devices, such as e-cigarettes. If you need help quitting, ask your health care provider. Contact a health care provider if your symptoms do not improve after 2 weeks. This information is not intended to replace advice given to you by your health care provider. Make sure you discuss any questions you have with your health care provider. Document Revised: 09/13/2021 Document Reviewed: 10/04/2020 Elsevier Patient Education  2024 [employer].   If you have been instructed to have an in-person evaluation today at a local Urgent Care facility, please use the link below. It will take you to a list of all of our available [facility], including address, phone number and hours of operation. Please do not delay care.  [facility]  If you or a family member do not have a primary care provider,  use the link below to schedule a visit and establish care. When you choose a [facility] primary care physician or advanced practice provider, you gain a long-term partner in health. Find a Primary Care Provider  Learn more about [facility]'s in-office and virtual care options:  - Get Care Now

## 2024-01-31 ENCOUNTER — Ambulatory Visit
Admission: RE | Admit: 2024-01-31 | Discharge: 2024-01-31 | Disposition: A | Discharge status: Home / Self Care | Source: Ambulatory Visit | Attending: Orthopedic Surgery | Admitting: Orthopedic Surgery

## 2024-01-31 DIAGNOSIS — M542 Cervicalgia: Secondary | ICD-10-CM

## 2024-02-01 ENCOUNTER — Other Ambulatory Visit

## 2024-03-31 ENCOUNTER — Other Ambulatory Visit: Payer: Self-pay | Admitting: Family Medicine

## 2024-03-31 DIAGNOSIS — Z1231 Encounter for screening mammogram for malignant neoplasm of breast: Secondary | ICD-10-CM

## 2024-04-12 ENCOUNTER — Encounter (HOSPITAL_BASED_OUTPATIENT_CLINIC_OR_DEPARTMENT_OTHER): Payer: Self-pay | Admitting: Emergency Medicine

## 2024-04-12 ENCOUNTER — Emergency Department (HOSPITAL_BASED_OUTPATIENT_CLINIC_OR_DEPARTMENT_OTHER): Admitting: Radiology

## 2024-04-12 ENCOUNTER — Other Ambulatory Visit: Payer: Self-pay

## 2024-04-12 ENCOUNTER — Emergency Department (HOSPITAL_BASED_OUTPATIENT_CLINIC_OR_DEPARTMENT_OTHER)
Admission: EM | Admit: 2024-04-12 | Discharge: 2024-04-12 | Disposition: A | Discharge status: Home / Self Care | Attending: Emergency Medicine | Admitting: Emergency Medicine

## 2024-04-12 DIAGNOSIS — J40 Bronchitis, not specified as acute or chronic: Secondary | ICD-10-CM | POA: Insufficient documentation

## 2024-04-12 DIAGNOSIS — R059 Cough, unspecified: Secondary | ICD-10-CM | POA: Diagnosis present

## 2024-04-12 LAB — BASIC METABOLIC PANEL WITH GFR
Anion gap: 10 (ref 5–15)
BUN: 15 mg/dL (ref 8–23)
CO2: 27 mmol/L (ref 22–32)
Calcium: 9.8 mg/dL (ref 8.9–10.3)
Chloride: 103 mmol/L (ref 98–111)
Creatinine, Ser: 0.92 mg/dL (ref 0.44–1.00)
GFR, Estimated: >60 mL/min (ref >60)
Glucose, Bld: 89 mg/dL (ref 70–99)
Potassium: 4.1 mmol/L (ref 3.5–5.1)
Sodium: 141 mmol/L (ref 135–145)

## 2024-04-12 LAB — TROPONIN T, HIGH SENSITIVITY
Troponin T High Sensitivity: <15 ng/L (ref 0–19)
Troponin T High Sensitivity: <15 ng/L (ref 0–19)

## 2024-04-12 LAB — RESP PANEL BY RT-PCR (RSV, FLU A&B, COVID)  RVPGX2
Influenza A by PCR: NEGATIVE
Influenza B by PCR: NEGATIVE
Resp Syncytial Virus by PCR: NEGATIVE
SARS Coronavirus 2 by RT PCR: NEGATIVE

## 2024-04-12 LAB — CBC
HCT: 38.3 % (ref 36.0–46.0)
Hemoglobin: 12.4 g/dL (ref 12.0–15.0)
MCH: 27.9 pg (ref 26.0–34.0)
MCHC: 32.4 g/dL (ref 30.0–36.0)
MCV: 86.1 fL (ref 80.0–100.0)
Platelets: 286 K/uL (ref 150–400)
RBC: 4.45 MIL/uL (ref 3.87–5.11)
RDW: 13.5 % (ref 11.5–15.5)
WBC: 5.6 K/uL (ref 4.0–10.5)
nRBC: 0 % (ref 0.0–0.2)

## 2024-04-12 MED ORDER — DM-GUAIFENESIN ER 30-600 MG PO TB12: 1.0000 | ORAL_TABLET | Freq: Two times a day (BID) | ORAL | 0 refills | Status: AC

## 2024-04-12 MED ORDER — METHYLPREDNISOLONE 4 MG PO TBPK: ORAL_TABLET | ORAL | 0 refills | Status: AC

## 2024-04-12 MED ORDER — FLUTICASONE FUROATE-VILANTEROL 200-25 MCG/ACT IN AEPB: 1.0000 | INHALATION_SPRAY | Freq: Every day | RESPIRATORY_TRACT | 11 refills | Status: DC

## 2024-04-12 MED ORDER — METHYLPREDNISOLONE SODIUM SUCC 125 MG IJ SOLR
125.0000 mg | Freq: Once | INTRAMUSCULAR | Status: AC
  Administered 2024-04-12: 125 mg via INTRAVENOUS
  Filled 2024-04-12: qty 2

## 2024-04-12 MED ORDER — ALBUTEROL SULFATE (2.5 MG/3ML) 0.083% IN NEBU: 2.5000 mg | INHALATION_SOLUTION | Freq: Four times a day (QID) | RESPIRATORY_TRACT | 0 refills | Status: AC | PRN

## 2024-04-12 MED ORDER — ONDANSETRON HCL 4 MG/2ML IJ SOLN
4.0000 mg | Freq: Once | INTRAMUSCULAR | Status: AC
  Administered 2024-04-12: 4 mg via INTRAVENOUS
  Filled 2024-04-12: qty 2

## 2024-04-12 MED ORDER — AZITHROMYCIN 250 MG PO TABS: 250.0000 mg | ORAL_TABLET | Freq: Every day | ORAL | 0 refills | Status: AC

## 2024-04-12 MED ORDER — IPRATROPIUM-ALBUTEROL 0.5-2.5 (3) MG/3ML IN SOLN
3.0000 mL | Freq: Once | RESPIRATORY_TRACT | Status: AC
  Administered 2024-04-12: 3 mL via RESPIRATORY_TRACT
  Filled 2024-04-12: qty 3

## 2024-04-12 MED ORDER — ALBUTEROL SULFATE HFA 108 (90 BASE) MCG/ACT IN AERS: 2.0000 | INHALATION_SPRAY | RESPIRATORY_TRACT | 1 refills | Status: AC | PRN

## 2024-04-12 NOTE — ED Provider Notes (Signed)
 [place] EMERGENCY DEPARTMENT AT [facility] Provider Note   CSN: [id number] Arrival date & time: 04/12/24  1100     Patient presents with: Chest Pain   Kristina Washington is a 63 y.o. female.   HPI   63 year old female presents emergency department with concern for chest tightness, wheezing, intermittently productive cough.  This has been ongoing for for the past week, worsening for the past couple days.  She denies any fever but endorses chills and fatigue.  No known sick contacts.  Denies any acute chest pain or back pain.  No leg swelling.  Prior to Admission medications   Medication Sig Start Date End Date Taking? Authorizing Provider  albuterol  (PROVENTIL  HFA;VENTOLIN  HFA) 108 (90 Base) MCG/ACT inhaler Inhale 2 puffs into the lungs every 4 (four) hours as needed for wheezing. 08/06/15   Vicky Charleston, PA-C  albuterol  (PROVENTIL ) (2.5 MG/3ML) 0.083% nebulizer solution Take 3 mLs (2.5 mg total) by nebulization every 6 (six) hours as needed for wheezing or shortness of breath. 05/23/23   Floyd, Dan, DO  amoxicillin -clavulanate (AUGMENTIN ) 875-125 MG tablet Take 1 tablet by mouth 2 (two) times daily. 01/30/24   Vivienne Delon HERO, PA-C  Ascorbic Acid (VITAMIN C PO) Take 1 tablet by mouth daily.    [provider]  benzonatate  (TESSALON ) 100 MG capsule Take 1-2 capsules (100-200 mg total) by mouth 3 (three) times daily as needed. 01/30/24   Vivienne Delon HERO, PA-C  Cholecalciferol (VITAMIN D3) 50 MCG (2000 UT) TABS Take 2,000 Units by mouth daily.    [provider]  Cyanocobalamin (VITAMIN B 12 PO) Take by mouth.    [provider]  cyclobenzaprine  (FLEXERIL ) 10 MG tablet Take 1 tablet (10 mg total) by mouth 3 (three) times daily as needed for muscle spasms. 05/24/16   Street, [place], PA-C  doxycycline  (VIBRAMYCIN ) 100 MG capsule Take 1 capsule (100 mg total) by mouth 2 (two) times daily. One po bid x 7 days 05/23/23   Emil Share, DO   fluticasone  furoate-vilanterol (BREO ELLIPTA ) 200-25 MCG/ACT AEPB Inhale 1 puff into the lungs daily. 03/05/22   Neda Jennet LABOR, MD  hydrochlorothiazide  (HYDRODIURIL ) 25 MG tablet Take 25 mg by mouth daily. 04/15/19   [provider]  HYDROcodone -acetaminophen  (NORCO/VICODIN) 5-325 MG tablet 1-2 tabs PO q6 hours prn pain 11/07/22   Kuzma, Kevin, MD  ibuprofen (ADVIL) 800 MG tablet Take 800 mg by mouth every 8 (eight) hours as needed. 04/08/22   [provider]  ketoconazole (NIZORAL) 2 % cream Apply topically daily. 10/19/19   [provider]  losartan  (COZAAR ) 100 MG tablet Take 100 mg by mouth daily. 04/15/19   [provider]  Multiple Vitamin (MULTIVITAMIN WITH MINERALS) TABS tablet Take 1 tablet by mouth daily.    [provider]  predniSONE  (DELTASONE ) 10 MG tablet Days 1-4 take 4 tablets (40 mg) daily  Days 5-8 take 3 tablets (30 mg) daily, Days 9-11 take 2 tablets (20 mg) daily, Days 12-14 take 1 tablet (10 mg) daily. 01/30/24   Vivienne Delon HERO, PA-C  rosuvastatin (CRESTOR) 10 MG tablet Take 10 mg by mouth daily. 11/15/21   [provider]    Allergies: Patient has no known allergies.    Review of Systems  Constitutional:  Positive for chills and fever.  Respiratory:  Positive for cough, shortness of breath and wheezing.   Cardiovascular:  Negative for chest pain and leg swelling.  Gastrointestinal:  Negative for abdominal pain, diarrhea and vomiting.  Skin:  Negative for rash.  Neurological:  Negative for headaches.    Updated Vital Signs BP 126/76 (BP Location: Right Arm)   Pulse 84   Temp 99.3 F (37.4 C) (Oral)   Resp 18   Ht 5' 10 (1.778 m)   Wt 110.2 kg   LMP 03/01/2018 (Within Weeks)   SpO2 99%   BMI 34.87 kg/m   Physical Exam Vitals and nursing note reviewed.  Constitutional:      Appearance: Normal appearance.  HENT:     Head: Normocephalic.     Mouth/Throat:     Mouth: Mucous membranes are moist.   Cardiovascular:     Rate and Rhythm: Normal rate.  Pulmonary:     Effort: Pulmonary effort is normal. No respiratory distress.     Breath sounds: Examination of the right-lower field reveals decreased breath sounds. Examination of the left-lower field reveals decreased breath sounds. Decreased breath sounds and wheezing present. No rales.  Abdominal:     Palpations: Abdomen is soft.     Tenderness: There is no abdominal tenderness.  Musculoskeletal:     Right lower leg: No edema.     Left lower leg: No edema.  Skin:    General: Skin is warm.  Neurological:     Mental Status: She is alert and oriented to person, place, and time. Mental status is at baseline.  Psychiatric:        Mood and Affect: Mood normal.     (all labs ordered are listed, but only abnormal results are displayed) Labs Reviewed  RESP PANEL BY RT-PCR (RSV, FLU A&B, COVID)  [id number]  BASIC METABOLIC PANEL WITH GFR  CBC  TROPONIN T, HIGH SENSITIVITY    EKG: EKG Interpretation Date/Time:  Monday April 12 2024 11:11:40 EDT Ventricular Rate:  77 PR Interval:  215 QRS Duration:  95 QT Interval:  393 QTC Calculation: 445 R Axis:   -11  Text Interpretation: Sinus or ectopic atrial rhythm Borderline prolonged PR interval no change Confirmed by Bari Flank [id number]) on 04/12/2024 11:19:49 AM  Radiology: [facility] Chest 2 View Result Date: 04/12/2024 CLINICAL DATA:  Chest pain EXAM: CHEST - 2 VIEW COMPARISON:  05/23/2023 FINDINGS: Cardiomediastinal silhouette and pulmonary vasculature are within normal limits. Lungs are clear. IMPRESSION: No acute cardiopulmonary process. Electronically Signed   By: Aliene Lloyd M.D.   On: 04/12/2024 11:50     Procedures   Medications Ordered in the ED  methylPREDNISolone sodium succinate (SOLU-MEDROL) 125 mg/2 mL injection 125 mg (has no administration in time range)  ipratropium-albuterol  (DUONEB) 0.5-2.5 (3) MG/3ML nebulizer solution 3 mL (3 mLs Nebulization Given 04/12/24 1216)                                     Medical Decision Making Amount and/or Complexity of Data Reviewed Labs: ordered. Radiology: ordered.  Risk Prescription drug management.   63 year old female presents emergency department with chest tightness, shortness of breath, wheezing and productive cough.  Also endorses chills and fatigue.  Vitals are normal and stable here.  EKG shows no ischemic changes.  On presentation patient has scattered wheezing, decreased breath sounds, frequent cough on exam.  Chest x-ray shows no focal finding.  Blood work is baseline for the patient, respiratory panel is negative and troponin is negative.  After breathing treatment and medicine she feels improved.  Given the duration of her symptoms with comorbidities we will treat for URI,  presumed bronchitis.  Doubt PE at this time.  No findings of heart failure.  Patient at this time appears safe and stable for discharge and close outpatient follow up. Discharge plan and strict return to ED precautions discussed, patient verbalizes understanding and agreement.     Final diagnoses:  None    ED Discharge Orders     None          Bari Roxie HERO, DO 04/12/24 1427

## 2024-04-12 NOTE — ED Notes (Signed)
 Pt d/c instructions, medications, and follow-up care reviewed with pt. Pt verbalized understanding and had no further questions at time of d/c. Pt CA&Ox4, ambulatory, and in NAD at time of d/c

## 2024-04-12 NOTE — Discharge Instructions (Addendum)
 You have been seen and discharged from the emergency department.  You have been diagnosed with bronchitis.  Take medications as directed.  Continue to use your inhaler.  Follow-up with your primary provider for further evaluation and further care. Take home medications as prescribed. If you have any worsening symptoms or further concerns for your health please return to an emergency department for further evaluation.

## 2024-04-12 NOTE — ED Triage Notes (Signed)
 C/o chest tightness and SHOB since Friday. Hx of asthma and bronchitis.

## 2024-04-30 ENCOUNTER — Ambulatory Visit
Admission: RE | Admit: 2024-04-30 | Discharge: 2024-04-30 | Disposition: A | Discharge status: Home / Self Care | Source: Ambulatory Visit | Attending: Family Medicine | Admitting: Family Medicine

## 2024-04-30 DIAGNOSIS — Z1231 Encounter for screening mammogram for malignant neoplasm of breast: Secondary | ICD-10-CM

## 2024-05-08 ENCOUNTER — Emergency Department (HOSPITAL_BASED_OUTPATIENT_CLINIC_OR_DEPARTMENT_OTHER): Admitting: Radiology

## 2024-05-08 ENCOUNTER — Emergency Department (HOSPITAL_BASED_OUTPATIENT_CLINIC_OR_DEPARTMENT_OTHER)

## 2024-05-08 ENCOUNTER — Encounter (HOSPITAL_BASED_OUTPATIENT_CLINIC_OR_DEPARTMENT_OTHER): Payer: Self-pay

## 2024-05-08 ENCOUNTER — Emergency Department (HOSPITAL_BASED_OUTPATIENT_CLINIC_OR_DEPARTMENT_OTHER)
Admission: EM | Admit: 2024-05-08 | Discharge: 2024-05-08 | Disposition: A | Discharge status: Home / Self Care | Attending: Emergency Medicine | Admitting: Emergency Medicine

## 2024-05-08 ENCOUNTER — Other Ambulatory Visit: Payer: Self-pay

## 2024-05-08 DIAGNOSIS — R197 Diarrhea, unspecified: Secondary | ICD-10-CM | POA: Diagnosis not present

## 2024-05-08 DIAGNOSIS — Z79899 Other long term (current) drug therapy: Secondary | ICD-10-CM | POA: Diagnosis not present

## 2024-05-08 DIAGNOSIS — I10 Essential (primary) hypertension: Secondary | ICD-10-CM | POA: Diagnosis not present

## 2024-05-08 DIAGNOSIS — R0789 Other chest pain: Secondary | ICD-10-CM | POA: Insufficient documentation

## 2024-05-08 DIAGNOSIS — J45909 Unspecified asthma, uncomplicated: Secondary | ICD-10-CM | POA: Diagnosis not present

## 2024-05-08 DIAGNOSIS — R059 Cough, unspecified: Secondary | ICD-10-CM | POA: Insufficient documentation

## 2024-05-08 DIAGNOSIS — Z7951 Long term (current) use of inhaled steroids: Secondary | ICD-10-CM | POA: Diagnosis not present

## 2024-05-08 DIAGNOSIS — R600 Localized edema: Secondary | ICD-10-CM | POA: Insufficient documentation

## 2024-05-08 LAB — BASIC METABOLIC PANEL WITH GFR
Anion gap: 9 (ref 5–15)
BUN: 12 mg/dL (ref 8–23)
CO2: 29 mmol/L (ref 22–32)
Calcium: 9.6 mg/dL (ref 8.9–10.3)
Chloride: 103 mmol/L (ref 98–111)
Creatinine, Ser: 0.78 mg/dL (ref 0.44–1.00)
GFR, Estimated: >60 mL/min (ref >60)
Glucose, Bld: 105 mg/dL — ABNORMAL HIGH (ref 70–99)
Potassium: 4.1 mmol/L (ref 3.5–5.1)
Sodium: 141 mmol/L (ref 135–145)

## 2024-05-08 LAB — PRO BRAIN NATRIURETIC PEPTIDE: Pro Brain Natriuretic Peptide: <50 pg/mL (ref <300.0)

## 2024-05-08 LAB — RESP PANEL BY RT-PCR (RSV, FLU A&B, COVID)  RVPGX2
Influenza A by PCR: NEGATIVE
Influenza B by PCR: NEGATIVE
Resp Syncytial Virus by PCR: NEGATIVE
SARS Coronavirus 2 by RT PCR: NEGATIVE

## 2024-05-08 LAB — CBC
HCT: 37.3 % (ref 36.0–46.0)
Hemoglobin: 11.9 g/dL — ABNORMAL LOW (ref 12.0–15.0)
MCH: 27.8 pg (ref 26.0–34.0)
MCHC: 31.9 g/dL (ref 30.0–36.0)
MCV: 87.1 fL (ref 80.0–100.0)
Platelets: 265 K/uL (ref 150–400)
RBC: 4.28 MIL/uL (ref 3.87–5.11)
RDW: 13.8 % (ref 11.5–15.5)
WBC: 5.5 K/uL (ref 4.0–10.5)
nRBC: 0 % (ref 0.0–0.2)

## 2024-05-08 LAB — TROPONIN T, HIGH SENSITIVITY
Troponin T High Sensitivity: <15 ng/L (ref 0–19)
Troponin T High Sensitivity: <15 ng/L (ref 0–19)

## 2024-05-08 MED ORDER — IPRATROPIUM-ALBUTEROL 0.5-2.5 (3) MG/3ML IN SOLN
3.0000 mL | Freq: Once | RESPIRATORY_TRACT | Status: AC
  Administered 2024-05-08: 3 mL via RESPIRATORY_TRACT
  Filled 2024-05-08: qty 3

## 2024-05-08 NOTE — ED Provider Notes (Signed)
 [place] EMERGENCY DEPARTMENT AT [facility] Provider Note   CSN: [id number] Arrival date & time: 05/08/24  1539     Patient presents with: Chest Pain and Cough   Kristina Washington is a 63 y.o. female with past medical history of asthma, hypertension, hypercholesteremia who presents emergency department for evaluation of chest tightness.  Patient reports her chest has been feeling tight for the last few days.  She does report some exertional dyspnea.  She has been using her inhaler at home but it has not been providing her with relief.  She denies any fever, chills, body aches, productive cough, nausea, abdominal pain.  Patient does also report some diarrhea yesterday.  Patient also notes that her left leg is slightly more swollen than her normal.  She has been taking her blood pressure medications and a water pill every day as prescribed.   Chest Pain Associated symptoms: cough   Cough Associated symptoms: chest pain        Prior to Admission medications   Medication Sig Start Date End Date Taking? Authorizing Provider  albuterol  (PROVENTIL ) (2.5 MG/3ML) 0.083% nebulizer solution Take 3 mLs (2.5 mg total) by nebulization every 6 (six) hours as needed for wheezing or shortness of breath. 04/12/24   Horton, Roxie HERO, DO  albuterol  (VENTOLIN  HFA) 108 (90 Base) MCG/ACT inhaler Inhale 2 puffs into the lungs every 4 (four) hours as needed for wheezing. 04/12/24   Horton, Kristie M, DO  Ascorbic Acid (VITAMIN C PO) Take 1 tablet by mouth daily.    [provider]  azithromycin  (ZITHROMAX  Z-PAK) 250 MG tablet Take 1 tablet (250 mg total) by mouth daily. Take as directed 04/12/24   Horton, Roxie HERO, DO  benzonatate  (TESSALON ) 100 MG capsule Take 1-2 capsules (100-200 mg total) by mouth 3 (three) times daily as needed. 01/30/24   Vivienne Delon HERO, PA-C  Cholecalciferol (VITAMIN D3) 50 MCG (2000 UT) TABS Take 2,000 Units by mouth daily.    [provider]   Cyanocobalamin (VITAMIN B 12 PO) Take by mouth.    [provider]  cyclobenzaprine  (FLEXERIL ) 10 MG tablet Take 1 tablet (10 mg total) by mouth 3 (three) times daily as needed for muscle spasms. 05/24/16   Street, [place], PA-C  dextromethorphan-guaiFENesin  (MUCINEX  DM) 30-600 MG 12hr tablet Take 1 tablet by mouth 2 (two) times daily. 04/12/24   Horton, Roxie M, DO  fluticasone  furoate-vilanterol (BREO ELLIPTA ) 200-25 MCG/ACT AEPB Inhale 1 puff into the lungs daily. 04/12/24   Horton, Roxie HERO, DO  hydrochlorothiazide  (HYDRODIURIL ) 25 MG tablet Take 25 mg by mouth daily. 04/15/19   [provider]  HYDROcodone -acetaminophen  (NORCO/VICODIN) 5-325 MG tablet 1-2 tabs PO q6 hours prn pain 11/07/22   Kuzma, Kevin, MD  ibuprofen (ADVIL) 800 MG tablet Take 800 mg by mouth every 8 (eight) hours as needed. 04/08/22   [provider]  ketoconazole (NIZORAL) 2 % cream Apply topically daily. 10/19/19   [provider]  losartan  (COZAAR ) 100 MG tablet Take 100 mg by mouth daily. 04/15/19   [provider]  methylPREDNISolone  (MEDROL  DOSEPAK) 4 MG TBPK tablet Take as directed 04/12/24   Horton, Kristie M, DO  Multiple Vitamin (MULTIVITAMIN WITH MINERALS) TABS tablet Take 1 tablet by mouth daily.    [provider]  rosuvastatin (CRESTOR) 10 MG tablet Take 10 mg by mouth daily. 11/15/21   [provider]    Allergies: Patient has no known allergies.    Review of Systems  Respiratory:  Positive for cough.   Cardiovascular:  Positive for chest pain.    Updated Vital Signs BP 106/66   Pulse 72   Temp 98.1 F (36.7 C)   Resp 19   Ht 5' 10 (1.778 m)   Wt 112.5 kg   LMP 03/01/2018 (Within Weeks)   SpO2 98%   BMI 35.58 kg/m   Physical Exam Vitals and nursing note reviewed.  Constitutional:      Appearance: Normal appearance.  HENT:     Head: Normocephalic and atraumatic.     Mouth/Throat:     Mouth: Mucous membranes are moist.   Eyes:     General: No scleral icterus.       Right eye: No discharge.        Left eye: No discharge.     Conjunctiva/sclera: Conjunctivae normal.  Cardiovascular:     Rate and Rhythm: Normal rate and regular rhythm.     Pulses: Normal pulses.          Dorsalis pedis pulses are 2+ on the right side and 2+ on the left side.  Pulmonary:     Effort: Pulmonary effort is normal.     Breath sounds: Decreased breath sounds present.     Comments: Breath sounds are mildly diminished bilaterally.  No wheezing noted. Abdominal:     General: There is no distension.     Tenderness: There is no abdominal tenderness.  Musculoskeletal:        General: No deformity.     Cervical back: Normal range of motion.     Left lower leg: Edema present.     Comments: Left leg with 2+ edema, more significant than the right.  Patient also with tenderness to palpation on her calf.  DP pulse intact bilaterally.  Skin:    General: Skin is warm and dry.     Capillary Refill: Capillary refill takes less than 2 seconds.  Neurological:     Mental Status: She is alert.     Motor: No weakness.  Psychiatric:        Mood and Affect: Mood normal.     (all labs ordered are listed, but only abnormal results are displayed) Labs Reviewed  BASIC METABOLIC PANEL WITH GFR - Abnormal; Notable for the following components:      Result Value   Glucose, Bld 105 (*)    All other components within normal limits  CBC - Abnormal; Notable for the following components:   Hemoglobin 11.9 (*)    All other components within normal limits  RESP PANEL BY RT-PCR (RSV, FLU A&B, COVID)  [id number]  PRO BRAIN NATRIURETIC PEPTIDE  TROPONIN T, HIGH SENSITIVITY  TROPONIN T, HIGH SENSITIVITY    EKG: EKG Interpretation Date/Time:  Saturday May 08 2024 15:51:16 EST Ventricular Rate:  80 PR Interval:  202 QRS Duration:  92 QT Interval:  396 QTC Calculation: 456 R Axis:   -12  Text Interpretation: Normal sinus rhythm Normal ECG When  compared with ECG of 12-Apr-2024 11:11, PREVIOUS ECG IS PRESENT since last tracing no significant change Confirmed by Lenor Hollering [phone]) on 05/08/2024 4:27:01 PM  Radiology: [facility] Chest 2 View Result Date: 05/08/2024 EXAM: 2 VIEW(S) XRAY OF THE CHEST 05/08/2024 04:09:22 PM COMPARISON: 04/12/2024 CLINICAL HISTORY: chest pain FINDINGS: LUNGS AND PLEURA: No focal pulmonary opacity. No pleural effusion. No pneumothorax. HEART AND MEDIASTINUM: No acute abnormality of the cardiac and mediastinal silhouettes. BONES AND SOFT TISSUES: Degenerative changes in the spine. No acute osseous abnormality. IMPRESSION: 1.  No acute cardiopulmonary process. Electronically signed by: Elsie Gravely MD 05/08/2024 04:22 PM EST RP Workstation: [id number]    Procedures   Medications Ordered in the ED  ipratropium-albuterol  (DUONEB) 0.5-2.5 (3) MG/3ML nebulizer solution 3 mL (3 mLs Nebulization Given 05/08/24 1753)                                   Medical Decision Making Amount and/or Complexity of Data Reviewed Labs: ordered. Radiology: ordered.  Risk Prescription drug management.   This patient presents to the ED for concern of chest tightness, this involves an extensive number of treatment options, and is a complaint that carries with it a high risk of complications and morbidity.   Differential diagnosis includes: Bronchitis, asthma exacerbation, URI, flu, COVID, RSV, DVT, new onset COPD  Co morbidities:  history of asthma, recent bronchitis   Additional history: Patient was evaluated on 10/27 and diagnosed with bronchitis.  She was discharged with antibiotics and inhaler use.  He took all medications as prescribed. Lab Tests:  I Ordered, and personally interpreted labs.  The pertinent results include: No acute abnormalities to explain patient's symptoms  Imaging Studies:  I ordered imaging studies including chest x-ray and DVT study on left lower extremity I independently visualized and  interpreted imaging which showed   X-ray: No acute abnormalities  Ultrasound: Pending at this time  I agree with the radiologist interpretation  Cardiac Monitoring/ECG:  The patient was maintained on a cardiac monitor.  I personally viewed and interpreted the cardiac monitored which showed an underlying rhythm of: Sinus rhythm  Medicines ordered and prescription drug management:  I ordered medication including  Medications  ipratropium-albuterol  (DUONEB) 0.5-2.5 (3) MG/3ML nebulizer solution 3 mL (3 mLs Nebulization Given 05/08/24 1753)   for chest tightness and history of asthma Reevaluation of the patient after these medicines showed that the patient improved I have reviewed the patients home medicines and have made adjustments as needed  Test Considered:   none  Critical Interventions:   none  Consultations Obtained: None  Problem List / ED Course:     ICD-10-CM   1. Chest tightness  R07.89       MDM: 63 year old female who presents emergency department for evaluation of chest tightness and cough.  Patient was evaluated in the emergency department on 10/27 and diagnosed with bronchitis at that time.  Patient was given an albuterol  inhaler and antibiotics which she completed.  Patient also has a history of reactive airway disease.  Her symptoms have been present for the last 2 to 3 days.  She endorses some exertional dyspnea.  Lung sounds are diminished bilaterally.  I ordered a DuoNeb to try to open her airway.  Upon reassessment patient reports her breathing feels better.  However, her lung sounds do sound diminished.  Patient does does not feel that she needs an additional breathing treatment at this time.  Given her clinical stability, I am agreeable to this.  Her respiratory panel is negative.  Her troponin is less than 15.  Her BNP is less than 50.  Patient also reports unilateral leg swelling on her left lower extremity.  She does have some mild pain to palpation  in her distal extremity and calf.  No erythema noted.  Right and left DP pulses intact.  Patient's Wells criteria for DVT is a 2 considering her a moderate risk for DVT.  Therefore, I will order a DVT  study on the left lower extremity given patient's presentation.  Ultrasound is currently at this time.  Handoff has been given to Dr Lenor who will follow-up on her DVT study and make further recommendations based upon the results.     Dispostion:  After consideration of the diagnostic results and the patients response to treatment, I feel that the patient would benefit from further workup awaiting DVT results.   Final diagnoses:  Chest tightness    ED Discharge Orders     None          Torrence Marry GORMAN DEVONNA 05/08/24 1843    Lenor Hollering, MD 05/08/24 1930

## 2024-05-08 NOTE — ED Notes (Signed)
Spoke with lab to add on BNP

## 2024-05-08 NOTE — ED Notes (Signed)
 ED Provider at bedside.

## 2024-05-08 NOTE — ED Triage Notes (Signed)
 Pt reports being here on 10/27 for bronchitis. Similar symptoms returning 2-3 days ago (cough, SOB on exertion, and chest tightness). Denies fever, N/V. Mild diarrhea yesterday. Pt awake and alert, NAD noted in triage.

## 2024-05-08 NOTE — Discharge Instructions (Addendum)
Make an appointment to have close follow-up with your primary care doctor.  Return to the emergency room if you have any worsening symptoms. 

## 2024-05-08 NOTE — ED Notes (Signed)
 Ultrasound at bedside

## 2024-05-08 NOTE — ED Notes (Addendum)
 Ambulated pt while on pulse ox. Oxygen sat reading 90-94% and HR 80-90 while on Room Air. O2 sat at 98% when at rest. Pt still reporting chest tightness and not being able to take a deep breath. EDP made aware.

## 2024-05-10 ENCOUNTER — Ambulatory Visit: Payer: Self-pay

## 2024-05-10 NOTE — Telephone Encounter (Signed)
 FYI Only or Action Required?: Action required by provider: request for appointment.  Patient was last seen in primary care on 01/30/2024 by Vivienne Delon HERO, PA-C.  Called Nurse Triage reporting Cough.  Symptoms began several days ago.  Interventions attempted: Nothing.  Symptoms are: gradually worsening.Cough with SOB. Requests appointment Wednesday.   Triage Disposition: See HCP Within 4 Hours (Or PCP Triage)  Patient/caregiver understands and will follow disposition?: Yes     Copied from CRM #[id number]. Topic: Clinical - Red Word Triage >> May 10, 2024  8:25 AM Leila BROCKS wrote: Red Word that prompted transfer to Nurse Triage: Patient [phone] states been having tightness in the chest started for a few days. Patient went on Saturday to [facility], and a chest cray was done there's no pneumonia. Patient states is not getting enough air, when taking a breath having pain. Patient denies wheezing, dizziness, nor fever. Patient want to see Dr. Neda on Wednesday after 12 pm. Please advise. Reason for Disposition  [1] MILD difficulty breathing (e.g., minimal/no SOB at rest, SOB with walking, pulse < 100) AND [2] still present when not coughing  Answer Assessment - Initial Assessment Questions 1. ONSET: When did the cough begin?      Last week 2. SEVERITY: How bad is the cough today?      mild 3. SPUTUM: Describe the color of your sputum (e.g., none, dry cough; clear, white, yellow, green)     none 4. HEMOPTYSIS: Are you coughing up any blood? If Yes, ask: How much? (e.g., flecks, streaks, tablespoons, etc.)     no 5. DIFFICULTY BREATHING: Are you having difficulty breathing? If Yes, ask: How bad is it? (e.g., mild, moderate, severe)      yes 6. FEVER: Do you have a fever? If Yes, ask: What is your temperature, how was it measured, and when did it start?     no 7. CARDIAC HISTORY: Do you have any history of heart disease? (e.g., heart attack, congestive  heart failure)      no 8. LUNG HISTORY: Do you have any history of lung disease?  (e.g., pulmonary embolus, asthma, emphysema)     Asthma  9. PE RISK FACTORS: Do you have a history of blood clots? (or: recent major surgery, recent prolonged travel, bedridden)     no 10. OTHER SYMPTOMS: Do you have any other symptoms? (e.g., runny nose, wheezing, chest pain)       no 11. PREGNANCY: Is there any chance you are pregnant? When was your last menstrual period?       no 12. TRAVEL: Have you traveled out of the country in the last month? (e.g., travel history, exposures)       no  Protocols used: Cough - Acute Non-Productive-A-AH

## 2024-05-12 ENCOUNTER — Encounter: Payer: Self-pay | Admitting: Internal Medicine

## 2024-05-12 ENCOUNTER — Ambulatory Visit (INDEPENDENT_AMBULATORY_CARE_PROVIDER_SITE_OTHER): Admitting: Internal Medicine

## 2024-05-12 VITALS — BP 122/78 | HR 78 | Ht 70.0 in | Wt 257.0 lb

## 2024-05-12 DIAGNOSIS — J45901 Unspecified asthma with (acute) exacerbation: Secondary | ICD-10-CM | POA: Diagnosis not present

## 2024-05-12 MED ORDER — BUDESONIDE-FORMOTEROL FUMARATE 80-4.5 MCG/ACT IN AERO: INHALATION_SPRAY | RESPIRATORY_TRACT | 12 refills | Status: AC

## 2024-05-12 NOTE — Progress Notes (Addendum)
 Kristina Washington, female    DOB: May 20, 1961   MRN: [id number]   Brief patient profile:  76 yobf [place] pt never smoker  self-referred to pulmonary clinic 05/12/2024  for throat and chest congestion       Baseline saba  once or twice a month but always get sick in fall/winter transition> has used advair in past and not sure it helped  Oct  27/25   [facility]  URI >  rec zpak / steroid dosepack/ mucinex  dm/ tessalon  pearl > BREO  [facility]  05/08/24 cc chest tightness and neb didn't help much   History of Present Illness  05/12/2024  Pulmonary/ 1st office eval/Antania Hoefling  finsihed prednisone , took [employer] Complaint  Patient presents with   Shortness of Breath   Dyspnea:  sometimes present at rest - walking to car ok frm appt  x several days where it had been more difficult Cough: feels like something stuck in airways she needs to clear but on voluntary cough sounds completely dry Sleep: bed is flat/ one- three  pillows  SABA use: hfa x one on day of ov with about 50% effectivess in hfa technique (see a/p) 02 [id number]    No obvious day to day or daytime pattern/variability or assoc excess/ purulent sputum or mucus plugs or hemoptysis or cp or  subjective wheeze or overt sinus or hb symptoms.    Also denies any obvious fluctuation of symptoms with weather or environmental changes or other aggravating or alleviating factors except as outlined above   No unusual exposure hx or h/o childhood pna/ asthma or knowledge of premature birth.  Current Allergies, Complete Past Medical History, Past Surgical History, Family History, and Social History were reviewed in [employer] record.  ROS  The following are not active complaints unless bolded Hoarseness, sore throat/globus, dysphagia, dental problems, itching, sneezing,  nasal congestion or discharge of excess mucus or purulent secretions, ear ache,   fever, chills, sweats, unintended wt loss or wt gain, classically pleuritic or  exertional cp,  orthopnea pnd or arm/hand swelling  or leg swelling improving, presyncope, palpitations, abdominal pain, anorexia, nausea, vomiting, diarrhea  or change in bowel habits or change in bladder habits, change in stools or change in urine, dysuria, hematuria,  rash, arthralgias, visual complaints, headache, numbness, weakness or ataxia or problems with walking or coordination,  change in mood or  memory.             Outpatient Medications Prior to Visit  Medication Sig Dispense Refill   albuterol  (PROVENTIL ) (2.5 MG/3ML) 0.083% nebulizer solution Take 3 mLs (2.5 mg total) by nebulization every 6 (six) hours as needed for wheezing or shortness of breath. 75 mL 0   albuterol  (VENTOLIN  HFA) 108 (90 Base) MCG/ACT inhaler Inhale 2 puffs into the lungs every 4 (four) hours as needed for wheezing. 1 each 1   Ascorbic Acid (VITAMIN C PO) Take 1 tablet by mouth daily.     azithromycin  (ZITHROMAX  Z-PAK) 250 MG tablet Take 1 tablet (250 mg total) by mouth daily. Take as directed 6 tablet 0   benzonatate  (TESSALON ) 100 MG capsule Take 1-2 capsules (100-200 mg total) by mouth 3 (three) times daily as needed. 30 capsule 0   Cholecalciferol (VITAMIN D3) 50 MCG (2000 UT) TABS Take 2,000 Units by mouth daily.     Cyanocobalamin (VITAMIN B 12 PO) Take by mouth.     cyclobenzaprine  (FLEXERIL ) 10 MG tablet Take 1 tablet (10 mg total) by mouth  3 (three) times daily as needed for muscle spasms. 15 tablet 0   dextromethorphan-guaiFENesin  (MUCINEX  DM) 30-600 MG 12hr tablet Take 1 tablet by mouth 2 (two) times daily. 15 tablet 0   hydrochlorothiazide  (HYDRODIURIL ) 25 MG tablet Take 25 mg by mouth daily.     HYDROcodone -acetaminophen  (NORCO/VICODIN) 5-325 MG tablet 1-2 tabs PO q6 hours prn pain 15 tablet 0   ibuprofen (ADVIL) 800 MG tablet Take 800 mg by mouth every 8 (eight) hours as needed.     ketoconazole (NIZORAL) 2 % cream Apply topically daily.     losartan  (COZAAR ) 100 MG tablet Take 100 mg by mouth  daily.     methylPREDNISolone  (MEDROL  DOSEPAK) 4 MG TBPK tablet Take as directed 1 each 0   Multiple Vitamin (MULTIVITAMIN WITH MINERALS) TABS tablet Take 1 tablet by mouth daily.     rosuvastatin (CRESTOR) 10 MG tablet Take 10 mg by mouth daily.     fluticasone  furoate-vilanterol (BREO ELLIPTA ) 200-25 MCG/ACT AEPB Inhale 1 puff into the lungs daily. 60 each 11   No facility-administered medications prior to visit.    Past Medical History:  Diagnosis Date   Arthritis    Bronchial asthma    Bronchitis    Cataracts, bilateral    Complication of anesthesia    slow to wake up   Headache    Heart murmur    mild no cardiologist Dr. Luke    Hypercholesterolemia    Hypertension    Menorrhagia    MVP (mitral valve prolapse)    Pre-diabetes    pt. states pre Dm took metformin for a brief time as part of a weight loss program .no longer takes metformin Denies DM      Objective:     BP 122/78   Pulse 78   Ht 5' 10 (1.778 m)   Wt 257 lb (116.6 kg)   LMP 03/01/2018 (Within Weeks)   SpO2 97%   BMI 36.88 kg/m   SpO2: 97 % RA  Very pleasant amb mod obese (by BMI) bf nad    HEENT : Oropharynx  clear      Nasal turbinates mod turbinate edema s purulent secretions or polyps    NECK :  without  apparent JVD/ palpable Nodes/TM    LUNGS: no acc muscle use,  Nl contour chest which is clear to A and P bilaterally without cough on insp or exp maneuvers   CV:  RRR  no s3 or murmur or increase in P2, and no edema   ABD:  soft and nontender   MS:  Gait nl   ext warm without deformities Or obvious joint restrictions  calf tenderness, cyanosis or clubbing    SKIN: warm and dry without lesions    NEURO:  alert, approp, nl sensorium with  no motor or cerebellar deficits apparent.     I personally reviewed images and agree with radiology impression as follows:  CXR:   pa and lateral 05/08/24 No acute cp process   Labs ordered/ reviewed:    Recent Labs  Lab 05/08/24 1551  NA  141  K 4.1  CL 103  CO2 29  BUN 12  CREATININE 0.78  GLUCOSE 105*   Recent Labs  Lab 05/08/24 1551  HGB 11.9*  HCT 37.3  WBC 5.5  PLT 265     No results found for: TSH   Lab Results  Component Value Date   PROBNP <50.0 05/08/2024     Troponin T high sensitivity 04/12/24   <  15       Assessment     Assessment & Plan Persistent asthma with acute exacerbation, unspecified asthma severity Onset in her 20's with cold weather  - Acute URI end of [facility] 2025 rx with new BREO trial > no better with sense of chest tightness  - 05/12/2024  After extensive coaching inhaler device,  effectiveness =    75% with hfa so d/c BREO and rx Symbiocrt 80 2bid prn / max otc gerd rx just while flaring   Of the three most common causes of  Sub-acute / recurrent or chronic cough, only one (GERD)  can actually contribute to/ trigger  the other two (asthma and post nasal drip syndrome)  and perpetuate the cylce of cough.  While not intuitively obvious, many patients with chronic low grade reflux do not cough until there is a primary insult that disturbs the protective epithelial barrier and exposes sensitive nerve endings.   This is typically viral but can due to PNDS and  either may apply here.     >>> The point is that once this occurs, it is difficult to eliminate the cycle  using anything but a maximally effective acid suppression regimen at least in the short run, accompanied by an appropriate diet to address non acid GERD and control / eliminate the cough with use of hard rock candies and stop all DPIs in favor of symbicort  80 2bid with f/u in a few weeks if not better  In meantime : Re SABA :  I spent extra time with pt today reviewing appropriate use of albuterol  for prn use on exertion with the following points: 1) saba is for relief of sob that does not improve by walking a slower pace or resting but rather if the pt does not improve after trying this first. 2) If the pt is convinced, as  many are, that saba helps recover from activity faster then it's easy to tell if this is the case by re-challenging : ie stop, take the inhaler, then p 5 minutes try the exact same activity (intensity of workload) that just caused the symptoms and see if they are substantially diminished or not after saba 3) if there is an activity that reproducibly causes the symptoms, try the saba 15 min before the activity on alternate days   If in fact the saba really does help, then fine to continue to use it prn but advised may need to look closer at the maintenance regimen (symbicort  80 vs BREO 100)  being used to achieve better control of airways disease with exertion.   Discussed in detail all the  indications, usual  risks and alternatives  relative to the benefits with patient who agrees to proceed with Rx as outlined.            Each maintenance medication was reviewed in detail including emphasizing most importantly the difference between maintenance and prns and under what circumstances the prns are to be triggered using an action plan format where appropriate.  Total time for H and P, chart review, counseling, reviewing hfa  device(s) and generating customized AVS unique to this office visit / same day charting = 42 min for new pt eval with  multiple  refractory respiratory  symptoms of uncertain etiology.          AVS  Patient Instructions  Stop BREO and in its place >>> Symbicort  80 Take 2 puffs first thing in am and then another 2 puffs about 12 hours  later as needed    Use your albuterol  as a rescue medication to be used if you can't catch your breath by resting, slowing your pace,  or doing a relaxed purse lip breathing pattern.  - The less you use it, the better it will work when you need it. - Ok to use up to 2 puffs  every 4 hours if you must but call for  appointment if use goes up over your usual need - Don't leave home without it !!  (think of it like a spare tire or starter fluid  for your car)   Try prilosec otc 20mg   Take 30-60 min before first meal of the day and Pepcid ac (famotidine) 20 mg one after supper until cough is completely gone for at least a week without the need for cough suppression   GERD (REFLUX)  is an extremely common cause of respiratory symptoms just like yours , many times with no obvious heartburn at all.    It can be treated with medication, but also with lifestyle changes including elevation of the head of your bed (ideally with 6 -8inch blocks under the headboard of your bed),  Smoking cessation, avoidance of late meals, excessive alcohol, and avoid fatty foods, chocolate, peppermint, colas, red wine, and acidic juices such as orange juice.  NO MINT OR MENTHOL  PRODUCTS SO NO COUGH DROPS  USE SUGARLESS CANDY INSTEAD (Jolley ranchers or Stover's or Life Savers) or even ice chips will also do - the key is to swallow to prevent all throat clearing. NO OIL BASED VITAMINS - use powdered substitutes.  Avoid fish oil when coughing.          Ozell America, MD 05/12/2024

## 2024-05-12 NOTE — Patient Instructions (Addendum)
 Stop BREO and in its place >>> Symbicort  80 Take 2 puffs first thing in am and then another 2 puffs about 12 hours later as needed    Use your albuterol  as a rescue medication to be used if you can't catch your breath by resting, slowing your pace,  or doing a relaxed purse lip breathing pattern.  - The less you use it, the better it will work when you need it. - Ok to use up to 2 puffs  every 4 hours if you must but call for  appointment if use goes up over your usual need - Don't leave home without it !!  (think of it like a spare tire or starter fluid for your car)   Try prilosec otc 20mg   Take 30-60 min before first meal of the day and Pepcid ac (famotidine) 20 mg one after supper until cough is completely gone for at least a week without the need for cough suppression   GERD (REFLUX)  is an extremely common cause of respiratory symptoms just like yours , many times with no obvious heartburn at all.    It can be treated with medication, but also with lifestyle changes including elevation of the head of your bed (ideally with 6 -8inch blocks under the headboard of your bed),  Smoking cessation, avoidance of late meals, excessive alcohol, and avoid fatty foods, chocolate, peppermint, colas, red wine, and acidic juices such as orange juice.  NO MINT OR MENTHOL  PRODUCTS SO NO COUGH DROPS  USE SUGARLESS CANDY INSTEAD (Jolley ranchers or Stover's or Life Savers) or even ice chips will also do - the key is to swallow to prevent all throat clearing. NO OIL BASED VITAMINS - use powdered substitutes.  Avoid fish oil when coughing.

## 2024-05-12 NOTE — Assessment & Plan Note (Addendum)
 Onset in her 20's with cold weather  - Acute URI end of [facility] 2025 rx with new BREO trial > no better with sense of chest tightness  - 05/12/2024  After extensive coaching inhaler device,  effectiveness =    75% with hfa so d/c BREO and rx Symbiocrt 80 2bid prn / max otc gerd rx just while flaring   Of the three most common causes of  Sub-acute / recurrent or chronic cough, only one (GERD)  can actually contribute to/ trigger  the other two (asthma and post nasal drip syndrome)  and perpetuate the cylce of cough.  While not intuitively obvious, many patients with chronic low grade reflux do not cough until there is a primary insult that disturbs the protective epithelial barrier and exposes sensitive nerve endings.   This is typically viral but can due to PNDS and  either may apply here.     >>> The point is that once this occurs, it is difficult to eliminate the cycle  using anything but a maximally effective acid suppression regimen at least in the short run, accompanied by an appropriate diet to address non acid GERD and control / eliminate the cough with use of hard rock candies and stop all DPIs in favor of symbicort  80 2bid with f/u in a few weeks if not better  In meantime : Re SABA :  I spent extra time with pt today reviewing appropriate use of albuterol  for prn use on exertion with the following points: 1) saba is for relief of sob that does not improve by walking a slower pace or resting but rather if the pt does not improve after trying this first. 2) If the pt is convinced, as many are, that saba helps recover from activity faster then it's easy to tell if this is the case by re-challenging : ie stop, take the inhaler, then p 5 minutes try the exact same activity (intensity of workload) that just caused the symptoms and see if they are substantially diminished or not after saba 3) if there is an activity that reproducibly causes the symptoms, try the saba 15 min before the activity on  alternate days   If in fact the saba really does help, then fine to continue to use it prn but advised may need to look closer at the maintenance regimen (symbicort  80 vs BREO 100)  being used to achieve better control of airways disease with exertion.   Discussed in detail all the  indications, usual  risks and alternatives  relative to the benefits with patient who agrees to proceed with Rx as outlined.            Each maintenance medication was reviewed in detail including emphasizing most importantly the difference between maintenance and prns and under what circumstances the prns are to be triggered using an action plan format where appropriate.  Total time for H and P, chart review, counseling, reviewing hfa  device(s) and generating customized AVS unique to this office visit / same day charting = 42 min for new pt eval with  multiple  refractory respiratory  symptoms of uncertain etiology.

## 2024-07-30 ENCOUNTER — Ambulatory Visit (HOSPITAL_BASED_OUTPATIENT_CLINIC_OR_DEPARTMENT_OTHER): Admitting: Orthopaedic Surgery
